# Patient Record
Sex: Male | Born: 1990 | Race: White | Hispanic: No | Marital: Single | Smoking: Never smoker
Health system: Southern US, Community
[De-identification: ages and names within clinical notes are randomized; demographics above are authoritative.]

---

## 2012-07-13 ENCOUNTER — Emergency Department: Payer: Self-pay | Admitting: Emergency Medicine

## 2012-07-21 ENCOUNTER — Ambulatory Visit: Payer: Self-pay | Admitting: Orthopedic Surgery

## 2013-11-15 ENCOUNTER — Emergency Department: Payer: Self-pay | Admitting: Emergency Medicine

## 2013-11-27 ENCOUNTER — Emergency Department: Payer: Self-pay | Admitting: Emergency Medicine

## 2014-07-10 IMAGING — CR DG SHOULDER 3+V*R*
1 series · 3 of 3 positions shown · non-contrast
Comparison: none

REASON FOR EXAM: pain
COMMENTS:

[Series 3: w shoulder internal right · 0.14mm/px · 3 of 3 slices shown]
[im 1/3]
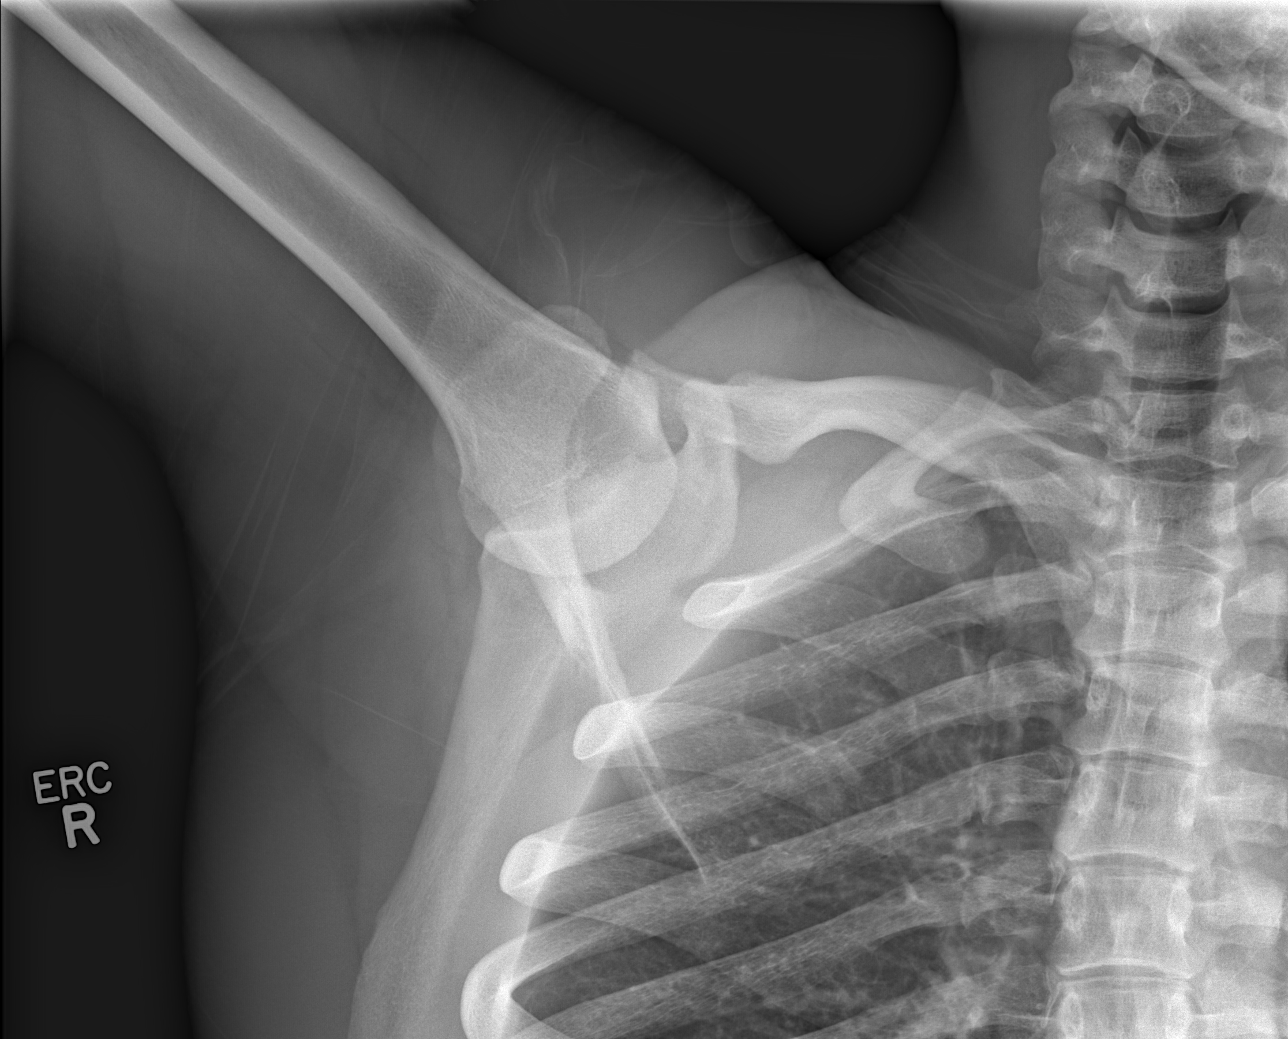
[im 2/3]
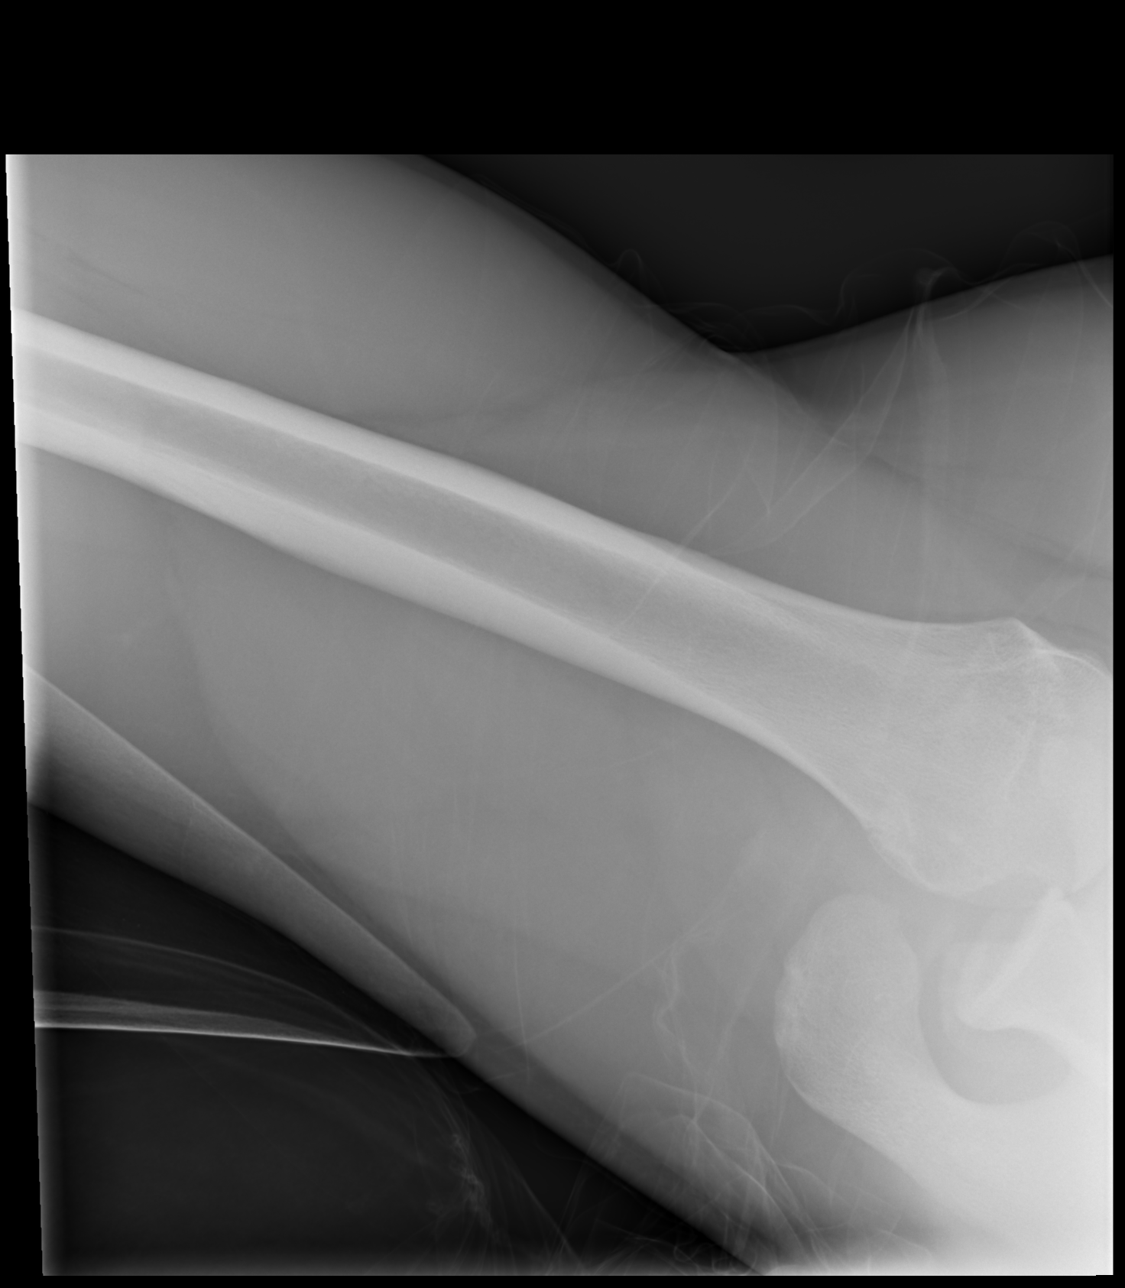
[im 3/3]
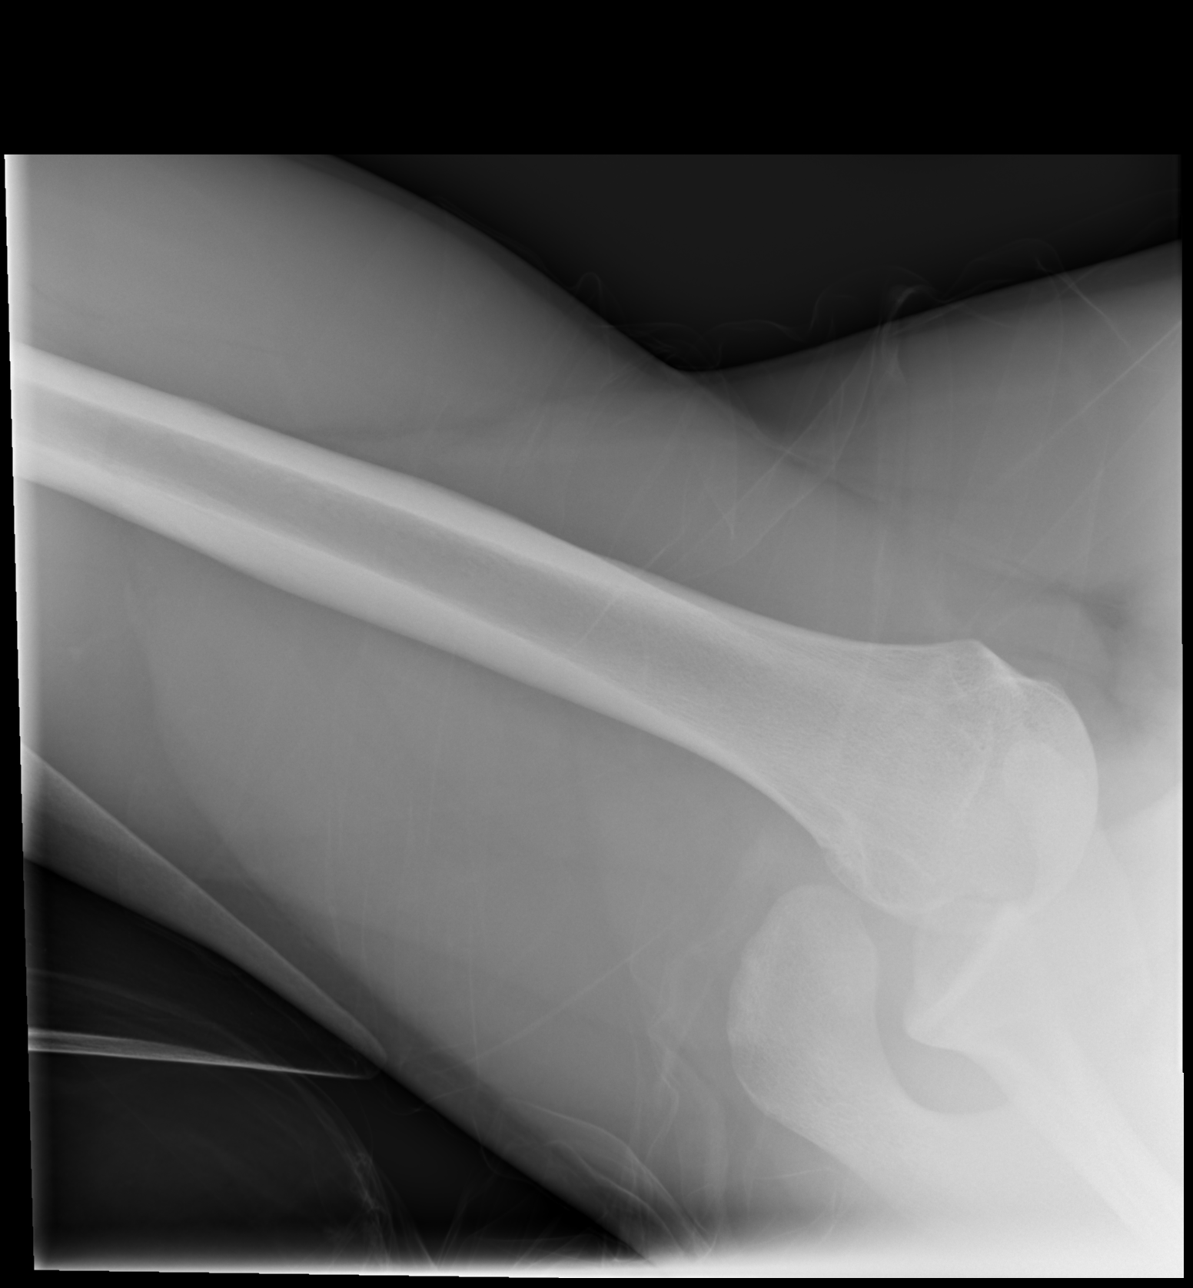

[3 of 3 positions shown; findings below may reference images not displayed]

PROCEDURE:     DXR - DXR SHOULDER RIGHT COMPLETE  - July 13, 2012  [DATE]

RESULT:     Three views of the right shoulder are submitted. The patient has
sustained anterior dislocation of the glenohumeral joint. No acute fracture
is demonstrated. The arm is fixed in abduction. The observed portions of the
right clavicle and of the upper ribs appear normal.
IMPRESSION: The patient has sustained anterior dislocation of the right
humeral head.

[REDACTED]

## 2016-07-01 ENCOUNTER — Encounter: Payer: Self-pay | Admitting: Emergency Medicine

## 2016-07-01 ENCOUNTER — Encounter
Admission: EM | Disposition: A | Discharge status: Home / Self Care | Payer: Self-pay | Source: Home / Self Care | Attending: Emergency Medicine

## 2016-07-01 ENCOUNTER — Emergency Department: Payer: BC Managed Care – PPO | Admitting: Anesthesiology

## 2016-07-01 ENCOUNTER — Observation Stay
Admission: EM | Admit: 2016-07-01 | Discharge: 2016-07-02 | Disposition: A | Discharge status: Home / Self Care | Payer: BC Managed Care – PPO | Attending: Surgery | Admitting: Surgery

## 2016-07-01 ENCOUNTER — Emergency Department: Payer: BC Managed Care – PPO

## 2016-07-01 DIAGNOSIS — K3580 Unspecified acute appendicitis: Secondary | ICD-10-CM | POA: Diagnosis present

## 2016-07-01 DIAGNOSIS — K353 Acute appendicitis with localized peritonitis: Secondary | ICD-10-CM | POA: Diagnosis not present

## 2016-07-01 DIAGNOSIS — Z882 Allergy status to sulfonamides status: Secondary | ICD-10-CM | POA: Insufficient documentation

## 2016-07-01 HISTORY — PX: LAPAROSCOPIC APPENDECTOMY: SHX408

## 2016-07-01 LAB — URINALYSIS, COMPLETE (UACMP) WITH MICROSCOPIC
BILIRUBIN URINE: NEGATIVE
Bacteria, UA: NONE SEEN
GLUCOSE, UA: NEGATIVE mg/dL
Ketones, ur: NEGATIVE mg/dL
LEUKOCYTES UA: NEGATIVE
Nitrite: NEGATIVE
PH: 6 (ref 5.0–8.0)
Protein, ur: 30 mg/dL — AB
Specific Gravity, Urine: 1.014 (ref 1.005–1.030)

## 2016-07-01 LAB — PROTIME-INR
INR: 1.31
Prothrombin Time: 16.4 seconds — ABNORMAL HIGH (ref 11.4–15.2)

## 2016-07-01 LAB — CBC
HCT: 47 % (ref 40.0–52.0)
Hemoglobin: 16.3 g/dL (ref 13.0–18.0)
MCH: 29.4 pg (ref 26.0–34.0)
MCHC: 34.6 g/dL (ref 32.0–36.0)
MCV: 85 fL (ref 80.0–100.0)
PLATELETS: 293 10*3/uL (ref 150–440)
RBC: 5.54 MIL/uL (ref 4.40–5.90)
RDW: 13.4 % (ref 11.5–14.5)
WBC: 21 10*3/uL — AB (ref 3.8–10.6)

## 2016-07-01 LAB — COMPREHENSIVE METABOLIC PANEL
ALT: 40 U/L (ref 17–63)
ANION GAP: 13 (ref 5–15)
AST: 39 U/L (ref 15–41)
Albumin: 5.4 g/dL — ABNORMAL HIGH (ref 3.5–5.0)
Alkaline Phosphatase: 33 U/L — ABNORMAL LOW (ref 38–126)
BUN: 16 mg/dL (ref 6–20)
CHLORIDE: 99 mmol/L — AB (ref 101–111)
CO2: 28 mmol/L (ref 22–32)
CREATININE: 1.24 mg/dL (ref 0.61–1.24)
Calcium: 9.9 mg/dL (ref 8.9–10.3)
Glucose, Bld: 132 mg/dL — ABNORMAL HIGH (ref 65–99)
Potassium: 3.9 mmol/L (ref 3.5–5.1)
Sodium: 140 mmol/L (ref 135–145)
Total Bilirubin: 1.8 mg/dL — ABNORMAL HIGH (ref 0.3–1.2)
Total Protein: 8.8 g/dL — ABNORMAL HIGH (ref 6.5–8.1)

## 2016-07-01 LAB — LIPASE, BLOOD: LIPASE: 138 U/L — AB (ref 11–51)

## 2016-07-01 LAB — APTT: aPTT: 33 seconds (ref 24–36)

## 2016-07-01 SURGERY — APPENDECTOMY, LAPAROSCOPIC
Anesthesia: General

## 2016-07-01 MED ORDER — FENTANYL CITRATE (PF) 250 MCG/5ML IJ SOLN
INTRAMUSCULAR | Status: AC
  Filled 2016-07-01: qty 5

## 2016-07-01 MED ORDER — ACETAMINOPHEN 10 MG/ML IV SOLN
INTRAVENOUS | Status: DC | PRN
  Administered 2016-07-01: 1000 mg via INTRAVENOUS

## 2016-07-01 MED ORDER — ONDANSETRON HCL 4 MG/2ML IJ SOLN
INTRAMUSCULAR | Status: AC
  Filled 2016-07-01: qty 2

## 2016-07-01 MED ORDER — KETOROLAC TROMETHAMINE 30 MG/ML IJ SOLN
30.0000 mg | Freq: Four times a day (QID) | INTRAMUSCULAR | Status: DC
  Administered 2016-07-02 (×3): 30 mg via INTRAVENOUS
  Filled 2016-07-01 (×10): qty 1

## 2016-07-01 MED ORDER — ONDANSETRON 4 MG PO TBDP: 4.0000 mg | ORAL_TABLET | Freq: Four times a day (QID) | ORAL | Status: DC | PRN

## 2016-07-01 MED ORDER — FENTANYL CITRATE (PF) 100 MCG/2ML IJ SOLN
INTRAMUSCULAR | Status: DC | PRN
  Administered 2016-07-01: 100 ug via INTRAVENOUS
  Administered 2016-07-01: 50 ug via INTRAVENOUS
  Administered 2016-07-01: 100 ug via INTRAVENOUS

## 2016-07-01 MED ORDER — PIPERACILLIN-TAZOBACTAM 3.375 G IVPB
3.3750 g | Freq: Three times a day (TID) | INTRAVENOUS | Status: AC
  Administered 2016-07-02 (×2): 3.375 g via INTRAVENOUS
  Filled 2016-07-01 (×2): qty 50

## 2016-07-01 MED ORDER — ACETAMINOPHEN 10 MG/ML IV SOLN
INTRAVENOUS | Status: AC
  Filled 2016-07-01: qty 100

## 2016-07-01 MED ORDER — ONDANSETRON HCL 4 MG/2ML IJ SOLN
INTRAMUSCULAR | Status: DC | PRN
  Administered 2016-07-01: 4 mg via INTRAVENOUS

## 2016-07-01 MED ORDER — PIPERACILLIN-TAZOBACTAM 3.375 G IVPB 30 MIN
3.3750 g | Freq: Once | INTRAVENOUS | Status: AC
  Administered 2016-07-01: 3.375 g via INTRAVENOUS
  Filled 2016-07-01: qty 50

## 2016-07-01 MED ORDER — ENOXAPARIN SODIUM 40 MG/0.4ML ~~LOC~~ SOLN
40.0000 mg | SUBCUTANEOUS | Status: DC
  Administered 2016-07-02: 40 mg via SUBCUTANEOUS
  Filled 2016-07-01 (×4): qty 0.4

## 2016-07-01 MED ORDER — KETOROLAC TROMETHAMINE 30 MG/ML IJ SOLN
30.0000 mg | Freq: Four times a day (QID) | INTRAMUSCULAR | Status: DC | PRN
  Filled 2016-07-01: qty 1

## 2016-07-01 MED ORDER — SUCCINYLCHOLINE CHLORIDE 20 MG/ML IJ SOLN
INTRAMUSCULAR | Status: AC
  Filled 2016-07-01: qty 1

## 2016-07-01 MED ORDER — ONDANSETRON HCL 4 MG/2ML IJ SOLN: 4.0000 mg | Freq: Four times a day (QID) | INTRAMUSCULAR | Status: DC | PRN

## 2016-07-01 MED ORDER — SUGAMMADEX SODIUM 200 MG/2ML IV SOLN
INTRAVENOUS | Status: DC | PRN
  Administered 2016-07-01: 180 mg via INTRAVENOUS

## 2016-07-01 MED ORDER — SUCCINYLCHOLINE CHLORIDE 20 MG/ML IJ SOLN
INTRAMUSCULAR | Status: DC | PRN
  Administered 2016-07-01: 100 mg via INTRAVENOUS

## 2016-07-01 MED ORDER — IOPAMIDOL (ISOVUE-300) INJECTION 61%
100.0000 mL | Freq: Once | INTRAVENOUS | Status: AC | PRN
  Administered 2016-07-01: 100 mL via INTRAVENOUS

## 2016-07-01 MED ORDER — PROPOFOL 10 MG/ML IV BOLUS
INTRAVENOUS | Status: DC | PRN
  Administered 2016-07-01: 200 mg via INTRAVENOUS

## 2016-07-01 MED ORDER — ROCURONIUM BROMIDE 100 MG/10ML IV SOLN
INTRAVENOUS | Status: DC | PRN
  Administered 2016-07-01: 10 mg via INTRAVENOUS
  Administered 2016-07-01: 20 mg via INTRAVENOUS

## 2016-07-01 MED ORDER — ROCURONIUM BROMIDE 100 MG/10ML IV SOLN
INTRAVENOUS | Status: AC
  Filled 2016-07-01: qty 1

## 2016-07-01 MED ORDER — FENTANYL CITRATE (PF) 100 MCG/2ML IJ SOLN: 25.0000 ug | INTRAMUSCULAR | Status: DC | PRN

## 2016-07-01 MED ORDER — IOPAMIDOL (ISOVUE-300) INJECTION 61%
30.0000 mL | Freq: Once | INTRAVENOUS | Status: AC | PRN
  Administered 2016-07-01: 30 mL via ORAL

## 2016-07-01 MED ORDER — SODIUM CHLORIDE 0.9 % IV BOLUS (SEPSIS): 1000.0000 mL | Freq: Once | INTRAVENOUS | Status: DC

## 2016-07-01 MED ORDER — LIDOCAINE HCL (PF) 2 % IJ SOLN
INTRAMUSCULAR | Status: AC
  Filled 2016-07-01: qty 2

## 2016-07-01 MED ORDER — PROMETHAZINE HCL 25 MG/ML IJ SOLN: 6.2500 mg | INTRAMUSCULAR | Status: DC | PRN

## 2016-07-01 MED ORDER — SEVOFLURANE IN SOLN
RESPIRATORY_TRACT | Status: AC
  Filled 2016-07-01: qty 250

## 2016-07-01 MED ORDER — ACETAMINOPHEN 500 MG PO TABS
1000.0000 mg | ORAL_TABLET | Freq: Four times a day (QID) | ORAL | Status: DC
  Administered 2016-07-02 (×3): 1000 mg via ORAL
  Filled 2016-07-01 (×3): qty 2

## 2016-07-01 MED ORDER — MIDAZOLAM HCL 2 MG/2ML IJ SOLN
INTRAMUSCULAR | Status: AC
  Filled 2016-07-01: qty 2

## 2016-07-01 MED ORDER — PROPOFOL 10 MG/ML IV BOLUS
INTRAVENOUS | Status: AC
  Filled 2016-07-01: qty 20

## 2016-07-01 MED ORDER — MORPHINE SULFATE (PF) 4 MG/ML IV SOLN: 2.0000 mg | INTRAVENOUS | Status: DC | PRN

## 2016-07-01 MED ORDER — LIDOCAINE HCL (CARDIAC) 20 MG/ML IV SOLN
INTRAVENOUS | Status: DC | PRN
  Administered 2016-07-01: 30 mg via INTRAVENOUS

## 2016-07-01 MED ORDER — FENTANYL CITRATE (PF) 100 MCG/2ML IJ SOLN
INTRAMUSCULAR | Status: AC
  Filled 2016-07-01: qty 2

## 2016-07-01 MED ORDER — LACTATED RINGERS IV SOLN
INTRAVENOUS | Status: DC
  Administered 2016-07-02: 01:00:00 via INTRAVENOUS

## 2016-07-01 MED ORDER — MIDAZOLAM HCL 2 MG/2ML IJ SOLN
INTRAMUSCULAR | Status: DC | PRN
  Administered 2016-07-01: 2 mg via INTRAVENOUS

## 2016-07-01 MED ORDER — SODIUM CHLORIDE 0.9 % IV BOLUS (SEPSIS)
1000.0000 mL | Freq: Once | INTRAVENOUS | Status: AC
  Administered 2016-07-01: 1000 mL via INTRAVENOUS

## 2016-07-01 MED ORDER — KETOROLAC TROMETHAMINE 30 MG/ML IJ SOLN
INTRAMUSCULAR | Status: DC | PRN
  Administered 2016-07-01: 30 mg via INTRAVENOUS

## 2016-07-01 MED ORDER — PANTOPRAZOLE SODIUM 40 MG IV SOLR
40.0000 mg | Freq: Every day | INTRAVENOUS | Status: DC
  Administered 2016-07-02: 40 mg via INTRAVENOUS
  Filled 2016-07-01 (×3): qty 40

## 2016-07-01 MED ORDER — ONDANSETRON HCL 4 MG/2ML IJ SOLN
4.0000 mg | Freq: Once | INTRAMUSCULAR | Status: AC
  Administered 2016-07-01: 4 mg via INTRAVENOUS
  Filled 2016-07-01: qty 2

## 2016-07-01 MED ORDER — LACTATED RINGERS IV SOLN
INTRAVENOUS | Status: DC | PRN
  Administered 2016-07-01: 22:00:00 via INTRAVENOUS

## 2016-07-01 MED ORDER — BUPIVACAINE-EPINEPHRINE 0.25% -1:200000 IJ SOLN
INTRAMUSCULAR | Status: DC | PRN
  Administered 2016-07-01: 30 mL

## 2016-07-01 MED ORDER — OXYCODONE HCL 5 MG PO TABS: 5.0000 mg | ORAL_TABLET | ORAL | Status: DC | PRN

## 2016-07-01 SURGICAL SUPPLY — 37 items
APPLIER CLIP 5 13 M/L LIGAMAX5 (MISCELLANEOUS)
BLADE CLIPPER SURG (BLADE) ×3 IMPLANT
CANISTER SUCT 1200ML W/VALVE (MISCELLANEOUS) ×3 IMPLANT
CHLORAPREP W/TINT 26ML (MISCELLANEOUS) ×3 IMPLANT
CLIP APPLIE 5 13 M/L LIGAMAX5 (MISCELLANEOUS) IMPLANT
CUTTER FLEX LINEAR 45M (STAPLE) ×3 IMPLANT
DERMABOND ADVANCED (GAUZE/BANDAGES/DRESSINGS) ×2
DERMABOND ADVANCED .7 DNX12 (GAUZE/BANDAGES/DRESSINGS) ×1 IMPLANT
ELECT CAUTERY BLADE 6.4 (BLADE) ×3 IMPLANT
ELECT REM PT RETURN 9FT ADLT (ELECTROSURGICAL) ×3
ELECTRODE REM PT RTRN 9FT ADLT (ELECTROSURGICAL) ×1 IMPLANT
ENDOPOUCH RETRIEVER 10 (MISCELLANEOUS) ×3 IMPLANT
GLOVE BIO SURGEON STRL SZ7 (GLOVE) ×12 IMPLANT
GOWN STRL REUS W/ TWL LRG LVL3 (GOWN DISPOSABLE) ×2 IMPLANT
GOWN STRL REUS W/TWL LRG LVL3 (GOWN DISPOSABLE) ×4
IRRIGATION STRYKERFLOW (MISCELLANEOUS) ×1 IMPLANT
IRRIGATOR STRYKERFLOW (MISCELLANEOUS) ×3
IV NS 1000ML (IV SOLUTION) ×2
IV NS 1000ML BAXH (IV SOLUTION) ×1 IMPLANT
NEEDLE HYPO 22GX1.5 SAFETY (NEEDLE) ×3 IMPLANT
NS IRRIG 500ML POUR BTL (IV SOLUTION) ×3 IMPLANT
PACK LAP CHOLECYSTECTOMY (MISCELLANEOUS) ×3 IMPLANT
PENCIL ELECTRO HAND CTR (MISCELLANEOUS) ×3 IMPLANT
RELOAD 45 VASCULAR/THIN (ENDOMECHANICALS) ×3 IMPLANT
RELOAD STAPLE TA45 3.5 REG BLU (ENDOMECHANICALS) ×3 IMPLANT
SCALPEL HARMONIC ACE (MISCELLANEOUS) ×3 IMPLANT
SCISSORS METZENBAUM CVD 33 (INSTRUMENTS) IMPLANT
SLEEVE ENDOPATH XCEL 5M (ENDOMECHANICALS) ×3 IMPLANT
SLEEVE SCD COMPRESS THIGH MED (MISCELLANEOUS) ×3 IMPLANT
SPONGE LAP 18X18 5 PK (GAUZE/BANDAGES/DRESSINGS) ×3 IMPLANT
SUT MNCRL AB 4-0 PS2 18 (SUTURE) ×3 IMPLANT
SUT VICRYL 0 AB UR-6 (SUTURE) ×6 IMPLANT
SYR 20CC LL (SYRINGE) ×3 IMPLANT
TRAY FOLEY W/METER SILVER 16FR (SET/KITS/TRAYS/PACK) IMPLANT
TROCAR XCEL BLUNT TIP 100MML (ENDOMECHANICALS) ×3 IMPLANT
TROCAR XCEL NON-BLD 5MMX100MML (ENDOMECHANICALS) ×3 IMPLANT
TUBING INSUF HEATED (TUBING) ×3 IMPLANT

## 2016-07-01 NOTE — ED Provider Notes (Addendum)
[facility] Emergency Department Provider Note  ____________________________________________  Time seen: Approximately 7:07 PM  I have reviewed the triage vital signs and the nursing notes.   HISTORY  Chief Complaint Abdominal Pain    HPI Gregory Walls is a 26 y.o. male , otherwise healthy, presenting with right lower quadrant pain with nausea and vomiting. The patient reports that last night he had some general malaise and nausea, and then today he developed vomiting. Initially he had periumbilical discomfort, which has now localized to the right lower quadrant over the last several hours. He is not had any fevers but he has had some shaking chills. Nothing makes the pain better or worse.   History reviewed. No pertinent past medical history.  There are no active problems to display for this patient.   History reviewed. No pertinent surgical history.    Allergies Bactrim [sulfamethoxazole-trimethoprim]  No family history on file.  Social History Social History  Substance Use Topics  . Smoking status: Never Smoker  . Smokeless tobacco: Not on file  . Alcohol use Not on file    Review of Systems Constitutional: No Fever, positive chills..No lightheadedness or syncope. Eyes: No visual changes. ENT: No sore throat. No congestion or rhinorrhea. Cardiovascular: Denies chest pain. Denies palpitations. Respiratory: Denies shortness of breath.  No cough. Gastrointestinal: Positive periumbilical, now localized the right lower quadrant abdominal pain.  Positive nausea, positive vomiting.  No diarrhea.  No constipation. Last bowel movement was 24 hours ago and it was normal. Genitourinary: Negative for dysuria. No penile discharge. No scrotal pain or swelling. Musculoskeletal: Negative for back pain. Skin: Negative for rash. Neurological: Negative for headaches. No focal numbness, tingling or weakness.   10-point ROS otherwise  negative.  ____________________________________________   PHYSICAL EXAM:  VITAL SIGNS: ED Triage Vitals  Enc Vitals Group     BP 07/01/16 1658 128/80     Pulse Rate 07/01/16 1658 (!) 124     Resp 07/01/16 1658 20     Temp 07/01/16 1658 98.8 F (37.1 C)     Temp Source 07/01/16 1658 Oral     SpO2 07/01/16 1658 99 %     Weight 07/01/16 1658 184 lb (83.5 kg)     Height 07/01/16 1658 6' (1.829 m)     Head Circumference --      Peak Flow --      Pain Score 07/01/16 1708 6     Pain Loc --      Pain Edu? --      Excl. in GC? --     Constitutional: Alert and oriented. Well appearing and in no acute distress. Answers questions appropriately. Eyes: Conjunctivae are normal.  EOMI. No scleral icterus. Head: Atraumatic. Nose: No congestion/rhinnorhea. Mouth/Throat: Mucous membranes are moist.  Neck: No stridor.  Supple.   Cardiovascular: Normal rate, regular rhythm. No murmurs, rubs or gallops.  Respiratory: Normal respiratory effort.  No accessory muscle use or retractions. Lungs CTAB.  No wheezes, rales or ronchi. Gastrointestinal: Soft, and nondistended.  Positive tenderness to palpation localized the right lower quadrant. No guarding or rebound.  No peritoneal signs. Musculoskeletal: No LE edema.  Neurologic:  A&Ox3.  Speech is clear.  Face and smile are symmetric.  EOMI.  Moves all extremities well. Skin:  Skin is warm, dry and intact. No rash noted. Psychiatric: Mood and affect are normal. Speech and behavior are normal.  Normal judgement.  ____________________________________________   LABS (all labs ordered are listed, but only abnormal  results are displayed)  Labs Reviewed  LIPASE, BLOOD - Abnormal; Notable for the following:       Result Value   Lipase 138 (*)    All other components within normal limits  COMPREHENSIVE METABOLIC PANEL - Abnormal; Notable for the following:    Chloride 99 (*)    Glucose, Bld 132 (*)    Total Protein 8.8 (*)    Albumin 5.4 (*)     Alkaline Phosphatase 33 (*)    Total Bilirubin 1.8 (*)    All other components within normal limits  CBC - Abnormal; Notable for the following:    WBC 21.0 (*)    All other components within normal limits  URINALYSIS, COMPLETE (UACMP) WITH MICROSCOPIC - Abnormal; Notable for the following:    Color, Urine YELLOW (*)    APPearance CLEAR (*)    Hgb urine dipstick LARGE (*)    Protein, ur 30 (*)    Squamous Epithelial / LPF 0-5 (*)    All other components within normal limits  PROTIME-INR - Abnormal; Notable for the following:    Prothrombin Time 16.4 (*)    All other components within normal limits  APTT   ____________________________________________  EKG  Not indicated ____________________________________________  RADIOLOGY  Ct Abdomen Pelvis W Contrast  Result Date: 07/01/2016 CLINICAL DATA:  Right lower quadrant abdomen pain for 2 days. EXAM: CT ABDOMEN AND PELVIS WITH CONTRAST TECHNIQUE: Multidetector CT imaging of the abdomen and pelvis was performed using the standard protocol following bolus administration of intravenous contrast. CONTRAST:  100mL ISOVUE-300 IOPAMIDOL (ISOVUE-300) INJECTION 61% COMPARISON:  None. FINDINGS: Lower chest: No acute abnormality. Hepatobiliary: No focal liver abnormality is seen. No gallstones, gallbladder wall thickening, or biliary dilatation. Pancreas: Unremarkable. No pancreatic ductal dilatation or surrounding inflammatory changes. Spleen: Normal in size without focal abnormality. Adrenals/Urinary Tract: Adrenal glands are unremarkable. Kidneys are normal, without renal calculi, focal lesion, or hydronephrosis. Bladder is unremarkable. Stomach/Bowel: The appendix is enlarged with enhancing wall and surrounding inflammation consistent with appendicitis. There is no small bowel obstruction or diverticulitis. The stomach is normal. Vascular/Lymphatic: No significant vascular findings are present. No enlarged abdominal or pelvic lymph nodes. Reproductive:  Prostate is unremarkable. Other: There is minimal umbilical herniation of mesenteric fat. No abdominopelvic ascites. Musculoskeletal: No acute or significant osseous findings. IMPRESSION: Acute appendicitis. These results will be called to the ordering clinician or representative by the Radiologist Assistant, and communication documented in the PACS or zVision Dashboard. Electronically Signed   By: Sherian ReinWei-Chen  Lin M.D.   On: 07/01/2016 19:47    ____________________________________________   PROCEDURES  Procedure(s) performed: None  Procedures  Critical Care performed: No ____________________________________________   INITIAL IMPRESSION / ASSESSMENT AND PLAN / ED COURSE  Pertinent labs & imaging results that were available during my care of the patient were reviewed by me and considered in my medical decision making (see chart for details).  26 y.o. male, otherwise healthy, presenting with periumbilical pain now localized the right lower quadrant associated with multiple episodes of nausea and vomiting. Overall, the patient is minimally tachycardic on arrival, but has normal heart rate on my exam. He does have an examination which is concerning for acute appendicitis. His laboratory studies show white blood cell count of 21.0. He does have some blood in his urine, but renal colic is less likely. He also has a mild elevation of his lipase, but he does not drink alcohol, does not have any right upper quadrant pain, so acute or pancreatitis as  the primary cause of his lipase elevation is very unlikely. We'll plan to get a CT of the abdomen, and initiate symptomatic treatment. At this time, the patient defers any pain medication but will give him an antiemetic prior to drinking contrast. Plan reevaluation for final disposition.  ----------------------------------------- 8:09 PM on 07/01/2016 -----------------------------------------  The patient does have acute appendicitis. I have spoken with  the general surgeon, who can admit the patient and evaluate him. When I went to the family now, they have stated that they do not want to have surgery here because of a family member that has had prior complications with surgery a [facility]. They have requested to be transferred to [facility]. I have called the transfer center. Zosyn has been ordered, the patient is nothing by mouth, and he is receiving intravenous fluids.  ----------------------------------------- 8:36 PM on 07/01/2016 -----------------------------------------  [facility] is on divert at this time, so the patient has agreed to stay here for surgery.  ____________________________________________  FINAL CLINICAL IMPRESSION(S) / ED DIAGNOSES  Final diagnoses:  Acute appendicitis, unspecified acute appendicitis type         NEW MEDICATIONS STARTED DURING THIS VISIT:  New Prescriptions   No medications on file      Rockne Menghini, MD 07/01/16 2010    Rockne Menghini, MD 07/01/16 2037

## 2016-07-01 NOTE — ED Notes (Signed)
CT called and notified that pt is ready for scan.

## 2016-07-01 NOTE — ED Notes (Signed)
Pt to CT scan.

## 2016-07-01 NOTE — Anesthesia Post-op Follow-up Note (Cosign Needed)
Anesthesia QCDR form completed.        

## 2016-07-01 NOTE — Transfer of Care (Signed)
Immediate Anesthesia Transfer of Care Note  Patient: Gregory Walls  Procedure(s) Performed: Procedure(s): APPENDECTOMY LAPAROSCOPIC (N/A)  Patient Location: PACU  Anesthesia Type:General  Level of Consciousness: sedated and patient cooperative  Airway & Oxygen Therapy: Patient Spontanous Breathing and Patient connected to face mask oxygen  Post-op Assessment: Report given to RN and Post -op Vital signs reviewed and stable  Post vital signs: Reviewed and stable  Last Vitals:  Vitals:   07/01/16 1900 07/01/16 1930  BP: (!) 142/87 (!) 144/92  Pulse: 98 (!) 110  Resp:    Temp:      Last Pain:  Vitals:   07/01/16 1841  TempSrc:   PainSc: 6          Complications: No apparent anesthesia complications

## 2016-07-01 NOTE — ED Triage Notes (Signed)
States lower R abd papin x 2 days.

## 2016-07-01 NOTE — Anesthesia Preprocedure Evaluation (Signed)
Anesthesia Evaluation  Patient identified by MRN, date of birth, ID band Patient awake    Reviewed: Allergy & Precautions, H&P , NPO status , Patient's Chart, lab work & pertinent test results, reviewed documented beta blocker date and time   History of Anesthesia Complications Negative for: history of anesthetic complications  Airway Mallampati: II  TM Distance: >3 FB Neck ROM: full    Dental  (+) Dental Advidsory Given, Teeth Intact Permanent retainer:   Pulmonary neg pulmonary ROS,           Cardiovascular Exercise Tolerance: Good negative cardio ROS       Neuro/Psych negative neurological ROS  negative psych ROS   GI/Hepatic negative GI ROS, Neg liver ROS,   Endo/Other  negative endocrine ROS  Renal/GU negative Renal ROS  negative genitourinary   Musculoskeletal   Abdominal   Peds  Hematology negative hematology ROS (+)   Anesthesia Other Findings History reviewed. No pertinent past medical history.   Reproductive/Obstetrics negative OB ROS                             Anesthesia Physical Anesthesia Plan  ASA: I and emergent  Anesthesia Plan: General ETT, Rapid Sequence and Cricoid Pressure   Post-op Pain Management:    Induction:   Airway Management Planned:   Additional Equipment:   Intra-op Plan:   Post-operative Plan:   Informed Consent: I have reviewed the patients History and Physical, chart, labs and discussed the procedure including the risks, benefits and alternatives for the proposed anesthesia with the patient or authorized representative who has indicated his/her understanding and acceptance.   Dental Advisory Given  Plan Discussed with: Anesthesiologist, CRNA and Surgeon  Anesthesia Plan Comments:         Anesthesia Quick Evaluation

## 2016-07-01 NOTE — Op Note (Signed)
laparascopic appendectomy   Velva HarmanBlayne W Vila Date of operation:  07/01/2016  Indications: The patient presented with a history of  abdominal pain. Workup has revealed findings consistent with acute appendicitis.  Pre-operative Diagnosis: Acute appendicitis without mention of peritonitis  Post-operative Diagnosis: Same  Surgeon: Sterling Bigiego Dezirea Mccollister, MD, FACS  Anesthesia: General with endotracheal tube  Findings:  Acute non perforated appendicitis with tip necrosis  Estimated Blood Loss: 10cc         Specimens: appendix         Complications:  none  Procedure Details  The patient was seen again in the preop area. The options of surgery versus observation were reviewed with the patient and/or family. The risks of bleeding, infection, recurrence of symptoms, negative laparoscopy, potential for an open procedure, bowel injury, abscess or infection, were all reviewed as well. The patient was taken to Operating Room, identified as Andrey CampanileBlayne W Ricotta and the procedure verified as laparoscopic appendectomy. A Time Out was held and the above information confirmed.  The patient was placed in the supine position and general anesthesia was induced.  Antibiotic prophylaxis was administered and VT E prophylaxis was in place. A Foley catheter was placed by the nursing staff.   The abdomen was prepped and draped in a sterile fashion. An infraumbilical incision was made. A cutdown technique was used to enter the abdominal cavity. Two vicryl stitches were placed on the fascia and a Hasson trocar inserted. Pneumoperitoneum obtained. Two 5 mm ports were placed under direct visualization.   The appendix was identified and found to be acutely inflamed.  The appendix was carefully dissected. The mesoappendix was divided with Harmonic scalpel. There was a loop of TI attached to the mesoappendix and we used very careful blunt dissection to dissect the appendix away from the bowel. The base of the appendix was dissected out  and divided with a standard load Endo GIA.The appendix was placed in a Endo Catch bag and removed via the Hasson port. The right lower quadrant and pelvis was then irrigated with  normal saline which was aspirated. Inspection  failed to identify any additional bleeding and there were no signs of bowel injury. Again the right lower quadrant was inspected there was no sign of bleeding or bowel injury therefore pneumoperitoneum was released, all ports were removed.  The umbilical fascia was closed with 0 Vicryl interrupted sutures and the skin incisions were approximated with subcuticular 4-0 Monocryl. Dermabond was placed The patient tolerated the procedure well, there were no complications. The sponge lap and needle count were correct at the end of the procedure.  The patient was taken to the recovery room in stable condition to be admitted for continued care.    Sterling Bigiego Ashleigh Luckow, MD FACS

## 2016-07-01 NOTE — Anesthesia Procedure Notes (Signed)
Procedure Name: Intubation Date/Time: 07/01/2016 10:12 PM Performed by: Waldo LaineJUSTIS, Lizmarie Witters Pre-anesthesia Checklist: Patient identified, Emergency Drugs available, Suction available, Timeout performed and Patient being monitored Patient Re-evaluated:Patient Re-evaluated prior to inductionOxygen Delivery Method: Circle system utilized Preoxygenation: Pre-oxygenation with 100% oxygen Intubation Type: IV induction, Rapid sequence and Cricoid Pressure applied Laryngoscope Size: Miller and 2 Grade View: Grade I Tube type: Oral Tube size: 7.5 mm Number of attempts: 1 Airway Equipment and Method: Stylet Placement Confirmation: ETT inserted through vocal cords under direct vision,  positive ETCO2 and breath sounds checked- equal and bilateral Secured at: 22 cm Tube secured with: Tape Dental Injury: Teeth and Oropharynx as per pre-operative assessment

## 2016-07-01 NOTE — H&P (Signed)
Patient ID: Gregory Walls, male   DOB: 06-25-1990, 26 y.o.   MRN: [id number]  HPI Gregory CampanileBlayne W Trapp is a 26 y.o. male with a 24-hour history of abdominal pain. Pain was initially periumbilical and NOW it localizes to the right lower quadrant and is sharp. Currently the pain is moderate to severe sharp in nature and constant. Pain is worsening with movement. Patient had associated nausea vomiting and chills. No previous abdominal operations. He has good cardiovascular reserve and is able to perform more than 6 Mets of activity without any shortness of breath or chest pain. CT scan personal review there is evidence of dilated appendix with periappendiceal fat inflammatory response. No evidence of perforation and no evidence of an abscess. No free air. His white count is elevated to 21,000 with slight increase in the creatinine.  HPI  History reviewed. No pertinent past medical history.  History reviewed. No pertinent surgical history.  No pertinent Fam Hx   Social History Social History  Substance Use Topics  . Smoking status: Never Smoker  . Smokeless tobacco: Not on file  . Alcohol use Not on file    Allergies  Allergen Reactions  . Bactrim [Sulfamethoxazole-Trimethoprim] Rash    chills    Current Facility-Administered Medications  Medication Dose Route Frequency Provider Last Rate Last Dose  . sodium chloride 0.9 % bolus 1,000 mL  1,000 mL Intravenous Once Sharma CovertNorman, Anne-Caroline, MD      . sodium chloride 0.9 % bolus 1,000 mL  1,000 mL Intravenous Once Masaichi Kracht, Merri Rayiego F, MD       No current outpatient prescriptions on file.     Review of Systems Full ROS  was asked and was negative except for the information on the HPI  Physical Exam Blood pressure (!) 145/98, pulse (!) 102, temperature 98.8 F (37.1 C), temperature source Oral, resp. rate 20, height 6' (1.829 m), weight 83.5 kg (184 lb), SpO2 100 %. CONSTITUTIONAL: NOn toxic but tachycardic. EYES: Pupils are equal, round, and  reactive to light, Sclera are non-icteric. EARS, NOSE, MOUTH AND THROAT: The oropharynx is clear. The oral mucosa is pink and moist. Hearing is intact to voice. LYMPH NODES:  Lymph nodes in the neck are normal. RESPIRATORY:  Lungs are clear. There is normal respiratory effort, with equal breath sounds bilaterally, and without pathologic use of accessory muscles. CARDIOVASCULAR: Heart is regular without murmurs, gallops, or rubs. GI: The abdomen is soft TTP RLQ + Mcburney sign. w focal peritonitis GU: Rectal deferred.   MUSCULOSKELETAL: Normal muscle strength and tone. No cyanosis or edema.   SKIN: Turgor is good and there are no pathologic skin lesions or ulcers. NEUROLOGIC: Motor and sensation is grossly normal. Cranial nerves are grossly intact. PSYCH:  Oriented to person, place and time. Affect is normal.  Data Reviewed  I have personally reviewed the patient's imaging, laboratory findings and medical records.    Assessment/Plan 26 year old male with acute appendicitis. I do think that given his physical exam and inflammatory response is better served with an appendectomy. The risks, benefits, complications, treatment options, and expected outcomes were discussed with the patient. The treatment of antibiotics alone was discussed giving a 20% chance that this could fail and surgery would be necessary.  Also discussed continuing to the operating room for Laparoscopic Appendectomy.  The possibilities of  bleeding, recurrent infection, perforation of viscus, finding a normal appendix, the need for additional procedures, failure to diagnose a condition, conversion to open procedure and creating a complication requiring transfusion  or further operations were discussed. The patient was given the opportunity to ask questions and have them answered.  Patient would like to proceed with Laparoscopic Appendectomy and consent was obtained.  I'll give him a liter of saline and start him on broad-spectrum  antibiotics. We will go ahead and post him tonight for an appendectomy  Sterling Big, MD FACS General Surgeon 07/01/2016, 9:07 PM

## 2016-07-02 ENCOUNTER — Encounter: Payer: Self-pay | Admitting: Surgery

## 2016-07-02 LAB — BASIC METABOLIC PANEL
ANION GAP: 6 (ref 5–15)
BUN: 14 mg/dL (ref 6–20)
CHLORIDE: 104 mmol/L (ref 101–111)
CO2: 29 mmol/L (ref 22–32)
Calcium: 8.8 mg/dL — ABNORMAL LOW (ref 8.9–10.3)
Creatinine, Ser: 1.25 mg/dL — ABNORMAL HIGH (ref 0.61–1.24)
GFR calc non Af Amer: >60 mL/min (ref >60)
GLUCOSE: 116 mg/dL — AB (ref 65–99)
POTASSIUM: 4.5 mmol/L (ref 3.5–5.1)
Sodium: 139 mmol/L (ref 135–145)

## 2016-07-02 LAB — CBC
HEMATOCRIT: 39.2 % — AB (ref 40.0–52.0)
HEMOGLOBIN: 13.7 g/dL (ref 13.0–18.0)
MCH: 29.8 pg (ref 26.0–34.0)
MCHC: 34.9 g/dL (ref 32.0–36.0)
MCV: 85.3 fL (ref 80.0–100.0)
Platelets: 228 10*3/uL (ref 150–440)
RBC: 4.59 MIL/uL (ref 4.40–5.90)
RDW: 13.3 % (ref 11.5–14.5)
WBC: 13.8 10*3/uL — ABNORMAL HIGH (ref 3.8–10.6)

## 2016-07-02 MED ORDER — OXYCODONE HCL 5 MG PO TABS: 5.0000 mg | ORAL_TABLET | ORAL | 0 refills | Status: DC | PRN

## 2016-07-02 NOTE — Discharge Summary (Signed)
Patient ID: Gregory Walls MRN: [id number] DOB/AGE: Nov 28, 1990 26 y.o.  Admit date: 07/01/2016 Discharge date: 07/02/2016  Discharge Diagnoses:  Appendicitis  Procedures Performed: Laparoscopic Appendectomy  Discharged Condition: good  Hospital Course: Patient brought into the hospital with appendicitis. Underwent an uncomplicated laparoscopic appendectomy. Able to be discharged home the first post operative day.  Discharge Orders: Discharge Instructions    Call MD for:  difficulty breathing, headache or visual disturbances    Complete by:  As directed    Call MD for:  persistant nausea and vomiting    Complete by:  As directed    Call MD for:  redness, tenderness, or signs of infection (pain, swelling, redness, odor or green/yellow discharge around incision site)    Complete by:  As directed    Call MD for:  severe uncontrolled pain    Complete by:  As directed    Call MD for:  temperature >100.4    Complete by:  As directed    Diet - low sodium heart healthy    Complete by:  As directed    Increase activity slowly    Complete by:  As directed       Disposition: Final discharge disposition not confirmed  Discharge Medications: Allergies as of 07/02/2016      Reactions   Bactrim [sulfamethoxazole-trimethoprim] Rash   chills      Medication List    TAKE these medications   oxyCODONE 5 MG immediate release tablet Commonly known as:  Oxy IR/ROXICODONE Take 1-2 tablets (5-10 mg total) by mouth every 4 (four) hours as needed for moderate pain.        Follwup: Follow-up Information    Pabon, HawaiiDiego F, MD. Go in 1 week(s).   Specialty:  General Surgery Why:  postop Contact information: [address] [place] [address] [phone]           Signed: Ricarda FrameCharles Leanore Biggers 07/02/2016, 12:45 PM

## 2016-07-02 NOTE — Progress Notes (Signed)
1 Day Post-Op   Subjective:  Status post lap scopic appendectomy. Patient reports feeling much better today. His diet.  Vital signs in last 24 hours: Temp:  [98 F (36.7 C)-99.6 F (37.6 C)] 98 F (36.7 C) (05/08 0431) Pulse Rate:  [63-135] 63 (05/08 0431) Resp:  [11-30] 16 (05/08 0431) BP: (110-145)/(55-98) 110/55 (05/08 0431) SpO2:  [98 %-100 %] 100 % (05/08 0431) Weight:  [83.5 kg (184 lb)-84.6 kg (186 lb 6.4 oz)] 84.6 kg (186 lb 6.4 oz) (05/08 0043) Last BM Date: 07/01/16  Intake/Output from previous day: 05/07 0701 - 05/08 0700 In: 1323 [P.O.:240; I.V.:1083] Out: 10 [Blood:10]  GI: Abdomen soft, appropriately tender to palpation at his incision sites, no right lower quadrant tenderness. Well approximated laparoscopic incision sites without evidence of erythema or drainage  Lab Results:  CBC  Recent Labs  07/01/16 1708 07/02/16 0535  WBC 21.0* 13.8*  HGB 16.3 13.7  HCT 47.0 39.2*  PLT 293 228   CMP     Component Value Date/Time   NA 139 07/02/2016 0535   K 4.5 07/02/2016 0535   CL 104 07/02/2016 0535   CO2 29 07/02/2016 0535   GLUCOSE 116 (H) 07/02/2016 0535   BUN 14 07/02/2016 0535   CREATININE 1.25 (H) 07/02/2016 0535   CALCIUM 8.8 (L) 07/02/2016 0535   PROT 8.8 (H) 07/01/2016 1708   ALBUMIN 5.4 (H) 07/01/2016 1708   AST 39 07/01/2016 1708   ALT 40 07/01/2016 1708   ALKPHOS 33 (L) 07/01/2016 1708   BILITOT 1.8 (H) 07/01/2016 1708   GFRNONAA >60 07/02/2016 0535   GFRAA >60 07/02/2016 0535   PT/INR  Recent Labs  07/01/16 1914  LABPROT 16.4*  INR 1.31    Studies/Results: Ct Abdomen Pelvis W Contrast  Result Date: 07/01/2016 CLINICAL DATA:  Right lower quadrant abdomen pain for 2 days. EXAM: CT ABDOMEN AND PELVIS WITH CONTRAST TECHNIQUE: Multidetector CT imaging of the abdomen and pelvis was performed using the standard protocol following bolus administration of intravenous contrast. CONTRAST:  100mL ISOVUE-300 IOPAMIDOL (ISOVUE-300) INJECTION 61%  COMPARISON:  None. FINDINGS: Lower chest: No acute abnormality. Hepatobiliary: No focal liver abnormality is seen. No gallstones, gallbladder wall thickening, or biliary dilatation. Pancreas: Unremarkable. No pancreatic ductal dilatation or surrounding inflammatory changes. Spleen: Normal in size without focal abnormality. Adrenals/Urinary Tract: Adrenal glands are unremarkable. Kidneys are normal, without renal calculi, focal lesion, or hydronephrosis. Bladder is unremarkable. Stomach/Bowel: The appendix is enlarged with enhancing wall and surrounding inflammation consistent with appendicitis. There is no small bowel obstruction or diverticulitis. The stomach is normal. Vascular/Lymphatic: No significant vascular findings are present. No enlarged abdominal or pelvic lymph nodes. Reproductive: Prostate is unremarkable. Other: There is minimal umbilical herniation of mesenteric fat. No abdominopelvic ascites. Musculoskeletal: No acute or significant osseous findings. IMPRESSION: Acute appendicitis. These results will be called to the ordering clinician or representative by the Radiologist Assistant, and communication documented in the PACS or zVision Dashboard. Electronically Signed   By: Sherian ReinWei-Chen  Lin M.D.   On: 07/01/2016 19:47    Assessment/Plan: 26 year old male status post laparoscopic appendectomy. Doing well. Encourage ambulation, incentive spirometer usage. Advanced diet as tolerated. Likely discharge home later today.   Ricarda Frameharles Sieanna Vanstone, MD [facility] General Surgeon [facility]  Day [facility] [phone](7a-7p) [phone] Night [facility] [phone](7p-7a) [phone]  07/02/2016

## 2016-07-02 NOTE — Discharge Instructions (Signed)
Laparoscopic Appendectomy, Adult, Care After Refer to this sheet in the next few weeks. These instructions provide you with information about caring for yourself after your procedure. Your health care provider may also give you more specific instructions. Your treatment has been planned according to current medical practices, but problems sometimes occur. Call your health care provider if you have any problems or questions after your procedure. What can I expect after the procedure? After the procedure, it is common to have:  A decrease in your energy level.  Mild pain in the area where the surgical cuts (incisions) were made.  Constipation. This can be caused by pain medicine and a decrease in your activity. Follow these instructions at home: Medicines   Take over-the-counter and prescription medicines only as told by your health care provider.  Do not drive for 24 hours if you received a sedative.  Do not drive or operate heavy machinery while taking prescription pain medicine.  If you were prescribed an antibiotic medicine, take it as told by your health care provider. Do not stop taking the antibiotic even if you start to feel better. Activity   For 3 weeks or as long as told by your health care provider:  Do not lift anything that is heavier than 10 pounds (4.5 kg).  Do not play contact sports.  Gradually return to your normal activities. Ask your health care provider what activities are safe for you.  Refrain from work at least 1 week after surgery. Your surgeon will release you to return to work. Bathing   Keep your incisions clean and dry. Clean them as often as told by your health care provider:  Gently wash the incisions with soap and water.  Rinse the incisions with water to remove all soap.  Pat the incisions dry with a clean towel. Do not rub the incisions.  You may take showers 24 hours after surgery.  Do not take baths, swim, or use hot tubs for 2 weeks or as  told by your health care provider. Incision care   Follow instructions from your healthcare provider about how to take care of your incisions. Make sure you:  Wash your hands with soap and water before you change your bandage (dressing). If soap and water are not available, use hand sanitizer.  Change your dressing as told by your health care provider.  Leave stitches (sutures), skin glue, or adhesive strips in place. These skin closures may need to stay in place for 2 weeks or longer. If adhesive strip edges start to loosen and curl up, you may trim the loose edges. Do not remove adhesive strips completely unless your health care provider tells you to do that.  Check your incision areas every day for signs of infection. Check for:  More redness, swelling, or pain.  More fluid or blood.  Warmth.  Pus or a bad smell. Other Instructions   If you were sent home with a drain, follow instructions from your health care provider about how to care for the drain and how to empty it.  Take deep breaths. This helps to prevent your lungs from becoming inflamed.  To relieve and prevent constipation:  Drink plenty of fluids.  Eat plenty of fruits and vegetables.  Keep all follow-up visits as told by your health care provider. This is important. Contact a health care provider if:  You have more redness, swelling, or pain around an incision.  You have more fluid or blood coming from an incision.  Your incision feels warm to the touch.  You have pus or a bad smell coming from an incision or dressing.  Your incision edges break open after your sutures have been removed.  You have increasing pain in your shoulders.  You feel dizzy or you faint.  You develop shortness of breath.  You keep feeling nauseous or vomiting.  You have diarrhea or you cannot control your bowel functions.  You lose your appetite.  You develop swelling or pain in your legs. Get help right away if:  You  have a fever.  You develop a rash.  You have difficulty breathing.  You have sharp pains in your chest. This information is not intended to replace advice given to you by your health care provider. Make sure you discuss any questions you have with your health care provider. Document Released: 02/11/2005 Document Revised: 07/14/2015 Document Reviewed: 08/01/2014 Elsevier Interactive Patient Education  2017 [employer]

## 2016-07-02 NOTE — Anesthesia Postprocedure Evaluation (Signed)
Anesthesia Post Note  Patient: Gregory Walls  Procedure(s) Performed: Procedure(s) (LRB): APPENDECTOMY LAPAROSCOPIC (N/A)  Patient location during evaluation: PACU Anesthesia Type: General Level of consciousness: awake and alert Pain management: pain level controlled Vital Signs Assessment: post-procedure vital signs reviewed and stable Respiratory status: spontaneous breathing, nonlabored ventilation, respiratory function stable and patient connected to nasal cannula oxygen Cardiovascular status: blood pressure returned to baseline and stable Postop Assessment: no signs of nausea or vomiting Anesthetic complications: no     Last Vitals:  Vitals:   07/02/16 0042 07/02/16 0133  BP: 132/72 118/64  Pulse: (!) 103 91  Resp: 20 16  Temp: 36.9 C 36.9 C    Last Pain:  Vitals:   07/02/16 0133  TempSrc: Oral  PainSc:                  Lenard SimmerAndrew Jillaine Waren

## 2016-07-03 LAB — HIV ANTIBODY (ROUTINE TESTING W REFLEX): HIV Screen 4th Generation wRfx: NONREACTIVE

## 2016-07-03 LAB — SURGICAL PATHOLOGY

## 2016-07-11 ENCOUNTER — Encounter: Payer: Self-pay | Admitting: Surgery

## 2016-07-11 ENCOUNTER — Ambulatory Visit (INDEPENDENT_AMBULATORY_CARE_PROVIDER_SITE_OTHER): Payer: BC Managed Care – PPO | Admitting: Surgery

## 2016-07-11 VITALS — BP 122/75 | HR 65 | Temp 98.1°F | Ht 72.0 in | Wt 187.0 lb

## 2016-07-11 DIAGNOSIS — Z09 Encounter for follow-up examination after completed treatment for conditions other than malignant neoplasm: Secondary | ICD-10-CM

## 2016-07-11 NOTE — Progress Notes (Signed)
S/p lap appy 5/7 Doing well no complaints Taking Po  PE NAD Abd: incisions c/d/i, no infection  A/P Doing well No complications No heavy lifting Path d/w pt

## 2016-07-11 NOTE — Patient Instructions (Addendum)
Please call our office with any questions or concerns.  You may now submerge in a tub, hot tub, or pool since incisions are completely sealed.  Use sun block to incision area over the next year if this area will be exposed to sun. This helps decrease scarring.  You may resume your normal activities on 08/12/2016. At that time- Listen to your body when lifting, if you have pain when lifting, stop and then try again in a few days. Pain after doing exercises or activities of daily living is normal as you get back in to your normal routine.  If you develop redness, drainage, or pain at incision sites- call our office immediately and speak with a nurse.

## 2016-07-15 ENCOUNTER — Telehealth: Payer: Self-pay

## 2016-07-15 NOTE — Telephone Encounter (Signed)
Spoke with patient at this time. He denies significant bruising and abdominal pain. He is curious as to why he has "knots" under his skin. I did explain that these "knots" that he is feeling directly under his incisions are most likely suture knots and will dissolve over time. He verbalizes understanding of this. The patient states that overall, he is doing wonderfully.

## 2016-07-15 NOTE — Telephone Encounter (Signed)
Returned phone call to patient at this time. No answer. Left voicemail for return phone call. 

## 2016-07-15 NOTE — Telephone Encounter (Signed)
Patient is returning your phone call. Please call patient and advice.   

## 2016-07-15 NOTE — Telephone Encounter (Signed)
Patient was just seen in our office on 07/11/16. He had a Laparoscopic Appendectomy on 07/01/16 with Dr. Everlene FarrierPabon. He is feeling a hard mass under his incision site and it's causing him no pain. He has been researching this online and feels like it might just be scar tissue but would like some clarification. Please call patient and advice.

## 2019-06-06 ENCOUNTER — Encounter: Payer: Self-pay | Admitting: Emergency Medicine

## 2019-06-06 ENCOUNTER — Emergency Department
Admission: EM | Admit: 2019-06-06 | Discharge: 2019-06-06 | Disposition: A | Discharge status: Home / Self Care | Payer: BC Managed Care – PPO | Attending: Emergency Medicine | Admitting: Emergency Medicine

## 2019-06-06 ENCOUNTER — Other Ambulatory Visit: Payer: Self-pay

## 2019-06-06 ENCOUNTER — Emergency Department: Payer: BC Managed Care – PPO

## 2019-06-06 DIAGNOSIS — S83015A Lateral dislocation of left patella, initial encounter: Secondary | ICD-10-CM | POA: Diagnosis not present

## 2019-06-06 DIAGNOSIS — Y9289 Other specified places as the place of occurrence of the external cause: Secondary | ICD-10-CM | POA: Diagnosis not present

## 2019-06-06 DIAGNOSIS — Y9389 Activity, other specified: Secondary | ICD-10-CM | POA: Diagnosis not present

## 2019-06-06 DIAGNOSIS — S83005A Unspecified dislocation of left patella, initial encounter: Secondary | ICD-10-CM

## 2019-06-06 DIAGNOSIS — S8992XA Unspecified injury of left lower leg, initial encounter: Secondary | ICD-10-CM | POA: Diagnosis present

## 2019-06-06 DIAGNOSIS — Y999 Unspecified external cause status: Secondary | ICD-10-CM | POA: Diagnosis not present

## 2019-06-06 DIAGNOSIS — X509XXA Other and unspecified overexertion or strenuous movements or postures, initial encounter: Secondary | ICD-10-CM | POA: Insufficient documentation

## 2019-06-06 MED ORDER — IBUPROFEN 600 MG PO TABS: 600.0000 mg | ORAL_TABLET | Freq: Four times a day (QID) | ORAL | 0 refills | Status: AC | PRN

## 2019-06-06 MED ORDER — HYDROMORPHONE HCL 1 MG/ML IJ SOLN
INTRAMUSCULAR | Status: AC
  Administered 2019-06-06: 22:00:00 0.5 mg via INTRAVENOUS
  Filled 2019-06-06: qty 1

## 2019-06-06 MED ORDER — OXYCODONE HCL 5 MG PO TABS: 5.0000 mg | ORAL_TABLET | Freq: Three times a day (TID) | ORAL | 0 refills | Status: AC | PRN

## 2019-06-06 MED ORDER — HYDROMORPHONE HCL 1 MG/ML IJ SOLN: 0.5000 mg | Freq: Once | INTRAMUSCULAR | Status: AC

## 2019-06-06 MED ORDER — HYDROMORPHONE HCL 1 MG/ML IJ SOLN
0.5000 mg | Freq: Once | INTRAMUSCULAR | Status: AC
  Administered 2019-06-06: 23:00:00 0.5 mg via INTRAVENOUS

## 2019-06-06 NOTE — ED Provider Notes (Signed)
[facility] Emergency Department Provider Note  ____________________________________________   First MD Initiated Contact with Patient 06/06/19 2210     (approximate)  I have reviewed the triage vital signs and the nursing notes.   HISTORY  Chief Complaint Knee Injury    HPI Gregory Walls is a 29 y.o. male with history of patella dislocation comes in with concerns for patella dislocation.  Patient states that just prior to arrival he was getting to his car and he felt his patella pop out of place.  States that he is had this happen before.  Patient is having pain in his knee that is constant, severe, nothing makes it better, worse with trying to move it.  Patient states that this is happened multiple times.          No past medical history on file.  Patient Active Problem List   Diagnosis Date Noted  . Appendicitis, acute 07/01/2016  . Acute appendicitis     Past Surgical History:  Procedure Laterality Date  . LAPAROSCOPIC APPENDECTOMY N/A 07/01/2016   Procedure: APPENDECTOMY LAPAROSCOPIC;  Surgeon: Jules Husbands, MD;  Location: [facility] ORS;  Service: General;  Laterality: N/A;    Prior to Admission medications   Not on File    Allergies Bactrim [sulfamethoxazole-trimethoprim]  No family history on file.  Social History Social History   Tobacco Use  . Smoking status: Never Smoker  . Smokeless tobacco: Never Used  Substance Use Topics  . Alcohol use: Not on file  . Drug use: Not on file      Review of Systems Constitutional: No fever/chills Eyes: No visual changes. ENT: No sore throat. Cardiovascular: Denies chest pain. Respiratory: Denies shortness of breath. Gastrointestinal: No abdominal pain.  No nausea, no vomiting.  No diarrhea.  No constipation. Genitourinary: Negative for dysuria. Musculoskeletal: Negative for back pain.  Positive knee pain Skin: Negative for rash. Neurological: Negative for headaches, focal  weakness or numbness. All other ROS negative ____________________________________________   PHYSICAL EXAM:  VITAL SIGNS: There were no vitals taken for this visit.  Constitutional: Alert and oriented. Well appearing and in no acute distress. Eyes: Conjunctivae are normal. EOMI. Head: Atraumatic. Nose: No congestion/rhinnorhea. Mouth/Throat: Mucous membranes are moist.   Neck: No stridor. Trachea Midline. FROM Cardiovascular: Normal rate, regular rhythm. Grossly normal heart sounds.  Good peripheral circulation. Respiratory: Normal respiratory effort.  No retractions. Lungs CTAB. Gastrointestinal: Soft and nontender. No distention. No abdominal bruits.  Musculoskeletal: Left leg flexed with obvious deformity to the knee with patella palpated laterally.  Good distal pulse. Neurologic:  Normal speech and language. No gross focal neurologic deficits are appreciated.  Skin:  Skin is warm, dry and intact. No rash noted. Psychiatric: Mood and affect are normal. Speech and behavior are normal. GU: Deferred   ____________________________________________  RADIOLOGY Robert Bellow, personally viewed and evaluated these images (plain radiographs) as part of my medical decision making, as well as reviewing the written report by the radiologist.  ED MD interpretation: No fracture  Official radiology report(s): DG Knee Complete 4 Views Left  Result Date: 06/06/2019 CLINICAL DATA:  Status post trauma. EXAM: LEFT KNEE - COMPLETE 4+ VIEW COMPARISON:  None. FINDINGS: No evidence of fracture, dislocation, or joint effusion. No evidence of arthropathy or other focal bone abnormality. Soft tissues are unremarkable. IMPRESSION: Negative. Electronically Signed   By: Virgina Norfolk M.D.   On: 06/06/2019 23:29    ____________________________________________   PROCEDURES  Procedure(s) performed (including Critical  Care):  Reduction of dislocation  Date/Time: 06/06/2019 11:38 PM Performed by:  Concha Se, MD Authorized by: Concha Se, MD  Consent: Verbal consent obtained. Consent given by: patient Patient identity confirmed: verbally with patient Time out: Immediately prior to procedure a "time out" was called to verify the correct patient, procedure, equipment, support staff and site/side marked as required. Local anesthesia used: no  Anesthesia: Local anesthesia used: no  Sedation: Patient sedated: no  Patient tolerance: patient tolerated the procedure well with no immediate complications Comments: Patient underwent patella dislocation reduction      ____________________________________________   INITIAL IMPRESSION / ASSESSMENT AND PLAN / ED COURSE  Gregory Walls was evaluated in Emergency Department on 06/06/2019 for the symptoms described in the history of present illness. He was evaluated in the context of the global COVID-19 pandemic, which necessitated consideration that the patient might be at risk for infection with the SARS-CoV-2 virus that causes COVID-19. Institutional protocols and algorithms that pertain to the evaluation of patients at risk for COVID-19 are in a state of rapid change based on information released by regulatory bodies including the CDC and federal and state organizations. These policies and algorithms were followed during the patient's care in the ED.    Patient comes in with history of patella dislocation.  Patient exam is consistent with patellar dislocation.  Patient not fall to the ground to suggest fractures.  He had good distal pulse and no evidence of vascular or neurological injury.  Patient was given IV Dilaudid and patient's dislocation was reduced easily.  Patient was placed in a knee immobilizer.  Patient had good distal pulse and was neuro intact post reduction.  X-ray post reduction had no evidence of fractures.  Patient will follow up with orthopedic surgeon.  Patient provided prescription for ibuprofen and a few doses of  oxycodone.  Patient understands not to drive or work while on this.  I discussed the provisional nature of ED diagnosis, the treatment so far, the ongoing plan of care, follow up appointments and return precautions with the patient and any family or support people present. They expressed understanding and agreed with the plan, discharged home.           ____________________________________________   FINAL CLINICAL IMPRESSION(S) / ED DIAGNOSES   Final diagnoses:  Dislocation of left patella, initial encounter      MEDICATIONS GIVEN DURING THIS VISIT:  Medications  HYDROmorphone (DILAUDID) injection 0.5 mg (has no administration in time range)  HYDROmorphone (DILAUDID) injection 0.5 mg (0.5 mg Intravenous Given 06/06/19 2223)     ED Discharge Orders         Ordered    ibuprofen (ADVIL) 600 MG tablet  Every 6 hours PRN     06/06/19 2337    oxyCODONE (ROXICODONE) 5 MG immediate release tablet  Every 8 hours PRN     06/06/19 2337           Note:  This document was prepared using Dragon voice recognition software and may include unintentional dictation errors.   Concha Se, MD 06/06/19 2340

## 2019-06-06 NOTE — ED Triage Notes (Signed)
Patient arrives via EMS after "patellar dislocation" this evening, patient has dislocated his patella before. No other complaints. Given fentanyl on ambulance

## 2019-06-06 NOTE — ED Notes (Signed)
Patient's knee reset by Dr. Fuller Plan. Patient tolerated well, knee immobilizer applied. Patient understands how to apply immobilizer. Patient provided with crutches. Wife is at bedside

## 2019-06-06 NOTE — Discharge Instructions (Addendum)
You had a patella dislocation.  We replaced it. You should use the knee immobilizer.  Take ibuprofen 600 mg every 6 hours for pain.  Take the oxycodone for breakthrough pain.  He should follow-up with orthopedic surgery.  You are weightbearing as tolerated.  IMPRESSION:  Negative.

## 2019-07-14 ENCOUNTER — Other Ambulatory Visit: Payer: Self-pay | Admitting: Orthopedic Surgery

## 2019-07-14 DIAGNOSIS — S83005A Unspecified dislocation of left patella, initial encounter: Secondary | ICD-10-CM

## 2019-07-29 ENCOUNTER — Ambulatory Visit
Admission: RE | Admit: 2019-07-29 | Discharge: 2019-07-29 | Disposition: A | Discharge status: Home / Self Care | Payer: BC Managed Care – PPO | Source: Ambulatory Visit | Attending: Orthopedic Surgery | Admitting: Orthopedic Surgery

## 2019-07-29 DIAGNOSIS — S83005A Unspecified dislocation of left patella, initial encounter: Secondary | ICD-10-CM | POA: Diagnosis present

## 2020-01-07 ENCOUNTER — Other Ambulatory Visit: Payer: Self-pay | Admitting: Nephrology

## 2020-01-07 DIAGNOSIS — R3129 Other microscopic hematuria: Secondary | ICD-10-CM

## 2020-01-07 DIAGNOSIS — R809 Proteinuria, unspecified: Secondary | ICD-10-CM

## 2020-01-14 ENCOUNTER — Ambulatory Visit
Admission: RE | Admit: 2020-01-14 | Discharge: 2020-01-14 | Disposition: A | Discharge status: Home / Self Care | Payer: BC Managed Care – PPO | Source: Ambulatory Visit | Attending: Nephrology | Admitting: Nephrology

## 2020-01-14 ENCOUNTER — Other Ambulatory Visit: Payer: Self-pay

## 2020-01-14 DIAGNOSIS — R809 Proteinuria, unspecified: Secondary | ICD-10-CM | POA: Diagnosis not present

## 2020-01-14 DIAGNOSIS — R3129 Other microscopic hematuria: Secondary | ICD-10-CM | POA: Diagnosis present

## 2021-03-08 ENCOUNTER — Ambulatory Visit: Payer: BC Managed Care – PPO | Admitting: Urology

## 2021-03-08 ENCOUNTER — Encounter: Payer: Self-pay | Admitting: Urology

## 2021-03-08 ENCOUNTER — Other Ambulatory Visit: Payer: Self-pay

## 2021-03-08 VITALS — BP 156/81 | HR 91 | Ht 71.0 in | Wt 210.0 lb

## 2021-03-08 DIAGNOSIS — R310 Gross hematuria: Secondary | ICD-10-CM

## 2021-03-08 DIAGNOSIS — R3121 Asymptomatic microscopic hematuria: Secondary | ICD-10-CM | POA: Diagnosis not present

## 2021-03-09 LAB — URINALYSIS, COMPLETE
Bilirubin, UA: NEGATIVE
Glucose, UA: NEGATIVE
Ketones, UA: NEGATIVE
Leukocytes,UA: NEGATIVE
Nitrite, UA: NEGATIVE
Protein,UA: NEGATIVE
Specific Gravity, UA: 1.01 (ref 1.005–1.030)
Urobilinogen, Ur: 0.2 mg/dL (ref 0.2–1.0)
pH, UA: 6.5 (ref 5.0–7.5)

## 2021-03-09 LAB — MICROSCOPIC EXAMINATION
Bacteria, UA: NONE SEEN
Epithelial Cells (non renal): NONE SEEN /hpf (ref 0–10)
WBC, UA: NONE SEEN /hpf (ref 0–5)

## 2021-03-10 NOTE — Progress Notes (Signed)
03/08/2021 10:21 AM   Gregory Walls 1990/11/18 [id number]  Referring provider: Tracie Harrier, MD [address] [facility] [place],  [place] [address]  Chief Complaint  Patient presents with   Hematuria    HPI: Gregory Walls is a 31 y.o. male referred for evaluation of hematuria.  States blood has been noted in his urine since he was in high school  Denies gross hematuria No bothersome LUTS Denies dysuria, gross hematuria Denies flank, abdominal or pelvic pain UA 01/2021 with 4-10 RBCs Appendicitis in 2018 and CT showed no renal, ureteral or bladder abnormalities Was referred to nephrology November 2021 and renal ultrasound was ordered which showed no abnormalities He did not follow-up with nephrology No tobacco history   PMH: History reviewed. No pertinent past medical history.  Surgical History: Past Surgical History:  Procedure Laterality Date   LAPAROSCOPIC APPENDECTOMY N/A 07/01/2016   Procedure: APPENDECTOMY LAPAROSCOPIC;  Surgeon: Jules Husbands, MD;  Location: [facility] ORS;  Service: General;  Laterality: N/A;    Home Medications:  Allergies as of 03/08/2021       Reactions   Bactrim [sulfamethoxazole-trimethoprim] Rash   chills        Medication List        Accurate as of March 08, 2021 11:59 PM. If you have any questions, ask your nurse or doctor.          tadalafil 5 MG tablet Commonly known as: CIALIS Take by mouth.   testosterone cypionate 100 MG/ML injection Commonly known as: DEPOTESTOTERONE CYPIONATE 60 mg 3 days a week        Allergies:  Allergies  Allergen Reactions   Bactrim [Sulfamethoxazole-Trimethoprim] Rash    chills    Family History: History reviewed. No pertinent family history.  Social History:  reports that he has never smoked. He has never used smokeless tobacco. He reports current alcohol use. He reports that he does not use drugs.   Physical Exam: BP (!) 156/81    Pulse 91    Ht 5'  11" (1.803 m)    Wt 210 lb (95.3 kg)    BMI 29.29 kg/m   Constitutional:  Alert and oriented, No acute distress. HEENT: [place] AT, moist mucus membranes.  Trachea midline, no masses. Cardiovascular: No clubbing, cyanosis, or edema. Respiratory: Normal respiratory effort, no increased work of breathing. Neurologic: Grossly intact, no focal deficits, moving all 4 extremities. Psychiatric: Normal mood and affect.  Laboratory Data:  Urinalysis Dipstick 1+ blood Microscopy 3-10 RBC  Pertinent Imaging: Renal ultrasound images were personally viewed and interpreted  US RENAL  Narrative CLINICAL DATA:  Microhematuria  EXAM: RENAL / URINARY TRACT ULTRASOUND COMPLETE  COMPARISON:  None.  FINDINGS: Right Kidney:  Renal measurements: 11.3 x 4.7 x 5.5 cm = volume: 150.3 mL. Echogenicity within normal limits. No mass or hydronephrosis visualized.  Left Kidney:  Renal measurements: 10.6 x 5 x 5.9 cm = volume: 164.6 mL. Echogenicity within normal limits. No mass or hydronephrosis visualized.  Bladder:  Appears normal for degree of bladder distention.  Other:  None.  IMPRESSION: Negative examination   Electronically Signed By: Donavan Foil M.D. On: 01/15/2020 23:48   Assessment & Plan:    1.  Microscopic hematuria AUA hematuria risk stratification: Low We discussed the recommended evaluation of low risk hematuria which includes renal ultrasound and cystoscopy He has had a negative renal ultrasound Since his microhematuria has been present for several years he wants to hold off on cystoscopy for  now We discussed lower tract evaluation is incomplete without cystoscopy.  Although the possibility of a bladder tumor is low this could not be diagnosed without cystoscopy. Recommend follow-up UA in 6 months and instructed to call earlier should he develop gross hematuria    Abbie Sons, MD  [facility] [address], [place], [place] [address] [phone]

## 2021-06-02 IMAGING — CR DG KNEE COMPLETE 4+V*L*
1 series · 4 of 4 positions shown · non-contrast
Comparison: None.

CLINICAL DATA: Status post trauma.

EXAM:
LEFT KNEE - COMPLETE 4+ VIEW

[Series 1: dg knee complete 4 views left · 0.14mm/px · 4 of 4 slices shown]
[im 1/4]
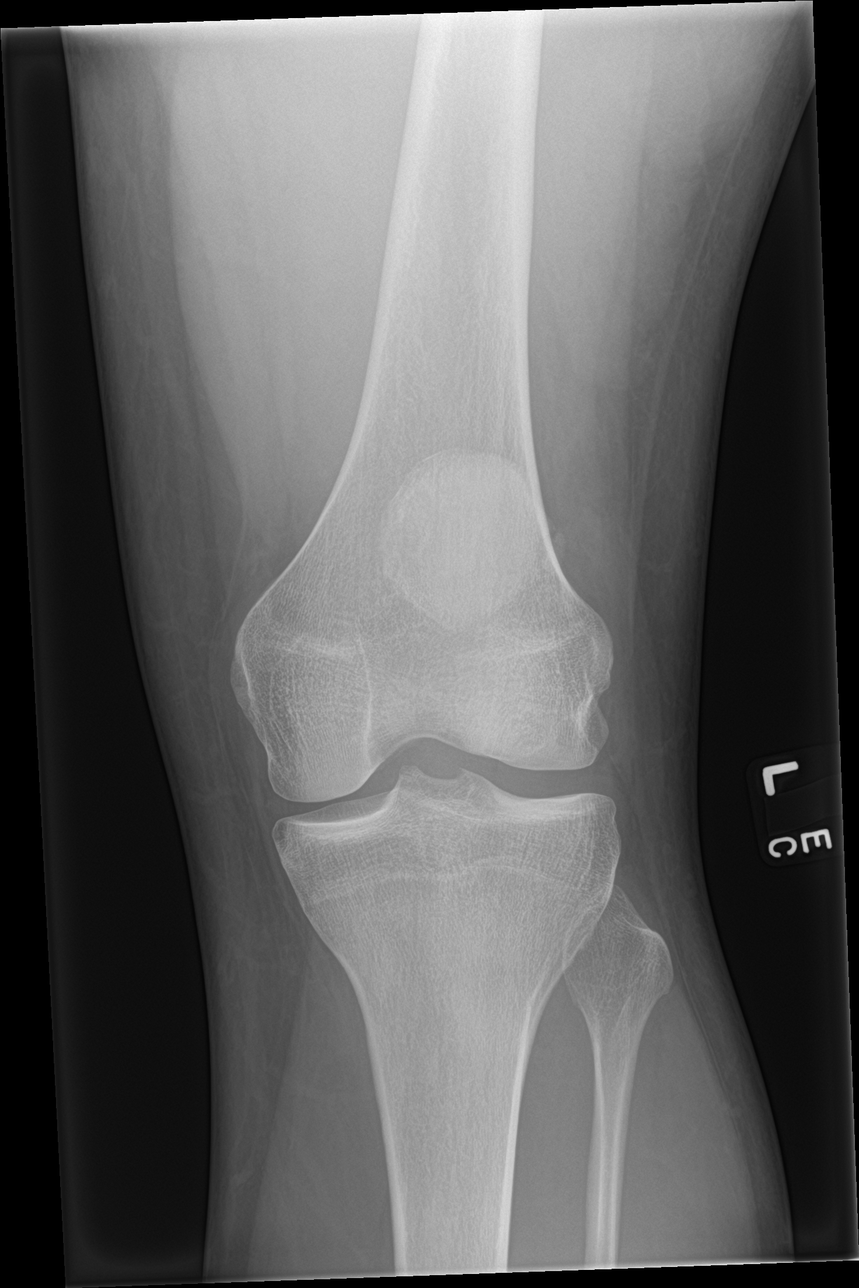
[im 2/4]
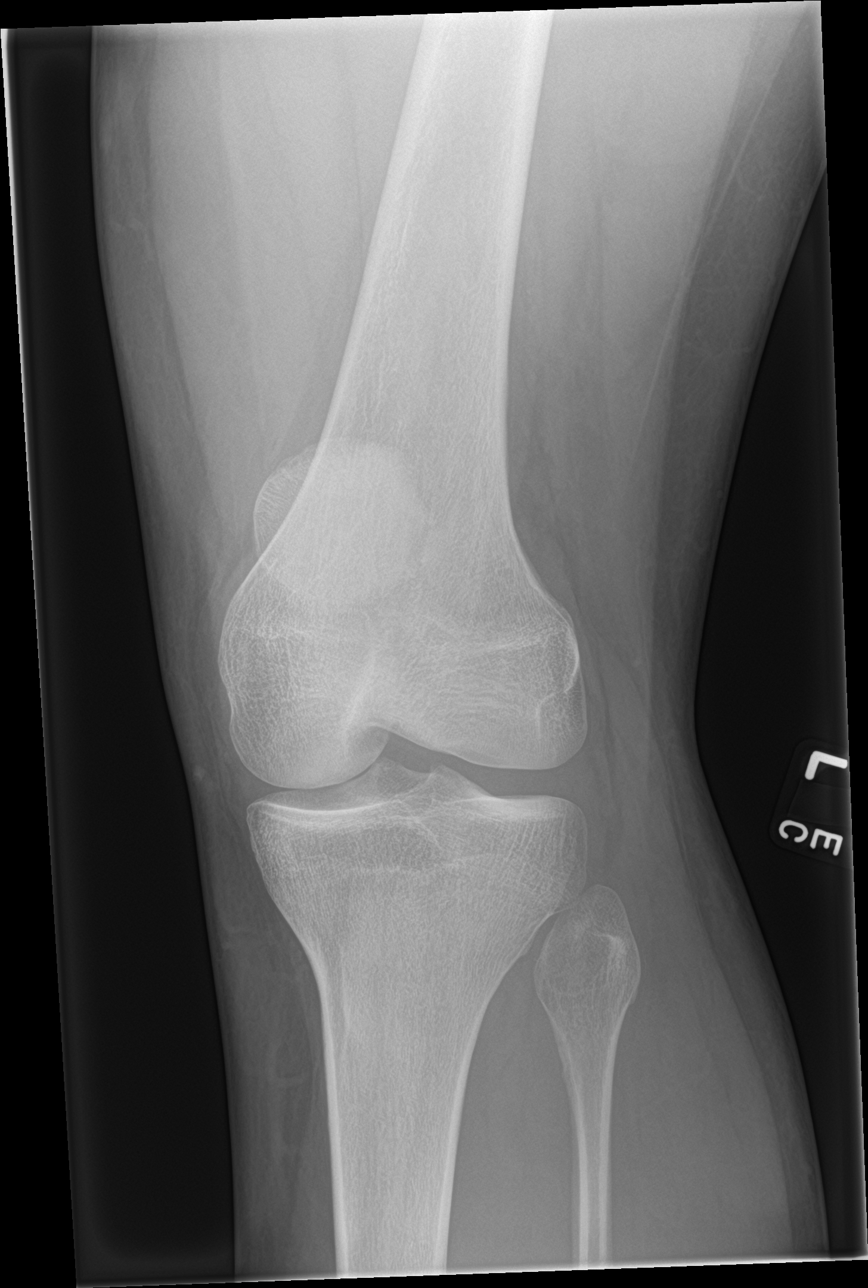
[im 3/4]
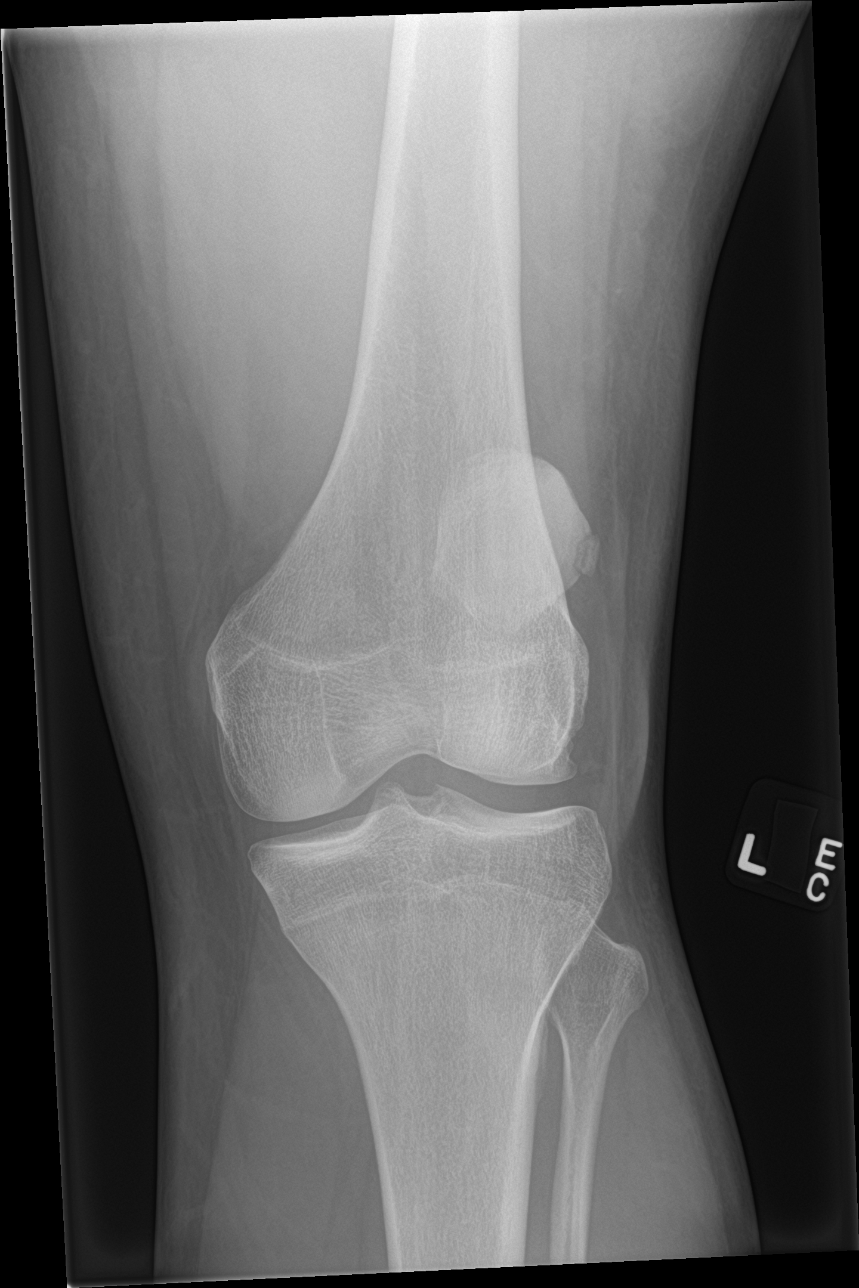
[im 4/4]
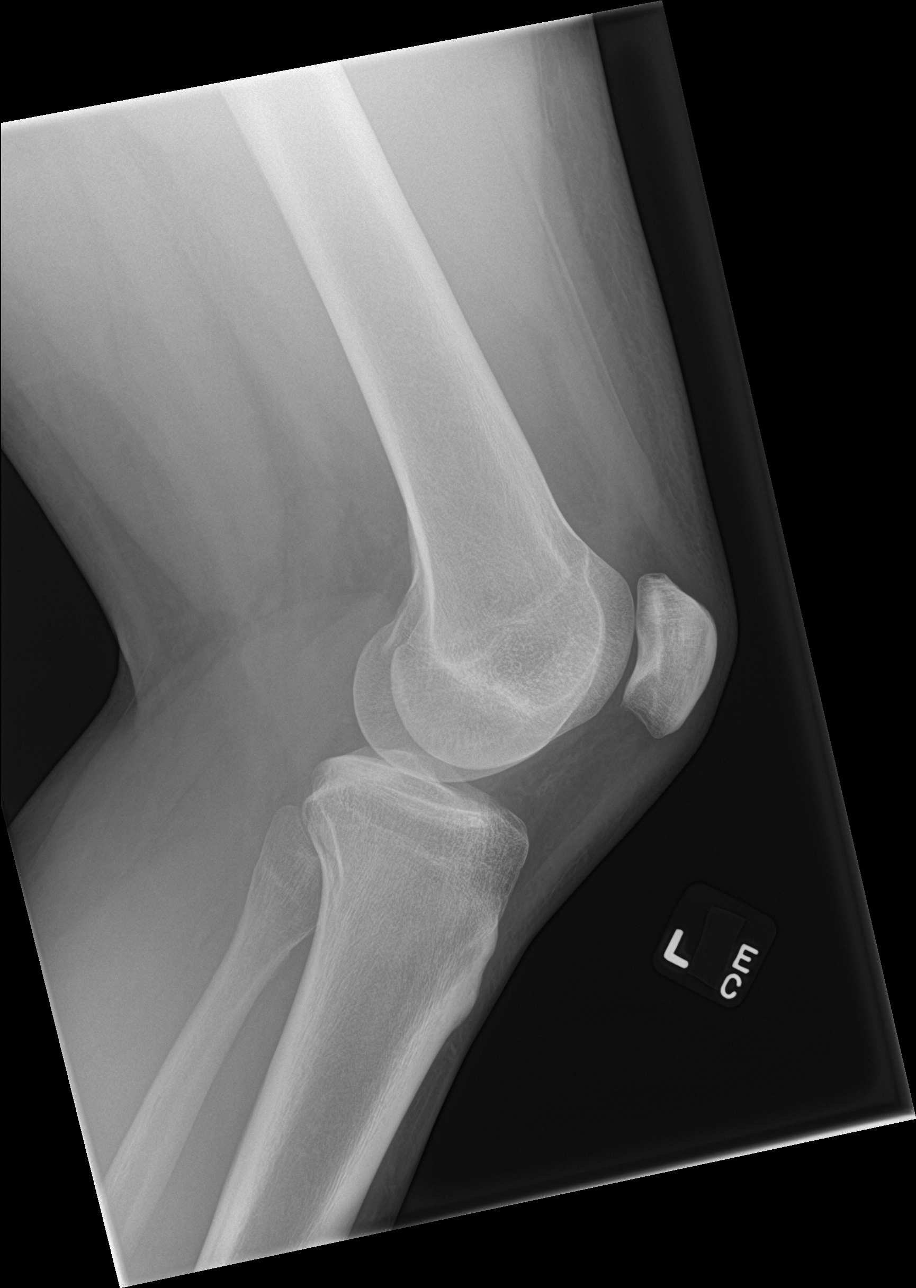

[4 of 4 positions shown; findings below may reference images not displayed]

FINDINGS: No evidence of fracture, dislocation, or joint effusion. No evidence
of arthropathy or other focal bone abnormality. Soft tissues are
unremarkable.
IMPRESSION: Negative.

## 2021-06-24 ENCOUNTER — Emergency Department
Admission: EM | Admit: 2021-06-24 | Discharge: 2021-06-24 | Disposition: A | Discharge status: Home / Self Care | Payer: BC Managed Care – PPO | Attending: Emergency Medicine | Admitting: Emergency Medicine

## 2021-06-24 ENCOUNTER — Emergency Department: Payer: BC Managed Care – PPO

## 2021-06-24 DIAGNOSIS — S43014A Anterior dislocation of right humerus, initial encounter: Secondary | ICD-10-CM | POA: Diagnosis not present

## 2021-06-24 DIAGNOSIS — W108XXA Fall (on) (from) other stairs and steps, initial encounter: Secondary | ICD-10-CM | POA: Insufficient documentation

## 2021-06-24 DIAGNOSIS — S4991XA Unspecified injury of right shoulder and upper arm, initial encounter: Secondary | ICD-10-CM | POA: Diagnosis present

## 2021-06-24 MED ORDER — LIDOCAINE HCL (PF) 1 % IJ SOLN
10.0000 mL | Freq: Once | INTRAMUSCULAR | Status: AC
  Administered 2021-06-24: 10 mL
  Filled 2021-06-24: qty 10

## 2021-06-24 NOTE — ED Notes (Signed)
Pt A&O, pt given discharge instructions, pt ambulating with steady gait. 

## 2021-06-24 NOTE — ED Provider Notes (Signed)
? ?[facility] ?Provider Note ? ? ? Event Date/Time  ? First MD Initiated Contact with Patient 06/24/21 1758   ?  (approximate) ? ? ?History  ? ?Shoulder Pain ? ? ?HPI ? ?Gregory Walls is a 31 y.o. male who presents to the ED for evaluation of Shoulder Pain ?  ?I reviewed orthopedic clinic visit from 2021.  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 ? ?Patient presents to the ED for evaluation of right shoulder pain and concerns for dislocation after accidentally tripping and falling on the steps.  Reports catching himself with his right arm and causing injury.  This occurred just prior to arrival.  Denies head trauma or syncope.  Denies any other pain.  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 ? ? ?Physical Exam  ? ?Triage Vital Signs: ?ED Triage Vitals  ?Enc Vitals Group  ?   BP 06/24/21 1806 (!) 145/88  ?   Pulse Rate 06/24/21 1806 100  ?   Resp 06/24/21 1806 18  ?   Temp 06/24/21 1806 97.9 ?F (36.6 ?C)  ?   Temp Source 06/24/21 1806 Oral  ?   SpO2 06/24/21 1806 100 %  ?   Weight --   ?   Height --   ?   Head Circumference --   ?   Peak Flow --   ?   Pain Score 06/24/21 1756 3  ?   Pain Loc --   ?   Pain Edu? --   ?   Excl. in GC? --   ? ? ?Most recent vital signs: ?Vitals:  ? 06/24/21 1806 06/24/21 1949  ?BP: (!) 145/88 137/90  ?Pulse: 100 92  ?Resp: 18 18  ?Temp: 97.9 ?F (36.6 ?C) 98 ?F (36.7 ?C)  ?SpO2: 100% 99%  ? ? ?General: Awake, no distress.  Sitting upright on the edge of the bed.  Has right arm and elbow on bedside table without much pain. ?CV:  Good peripheral perfusion.  ?Resp:  Normal effort.  ?Abd:  No distention.  ?MSK:  Right arm is distally neurovascularly intact.  No external signs of trauma.  Does seem to have an empty glenohumeral joint on the right ?Neuro:  No focal deficits appreciated. ?Other:   ? ? ?ED Results / Procedures / Treatments  ? ?Labs ?(all labs ordered are listed, but only abnormal results are  displayed) ?Labs Reviewed - No data to display ? ?EKG ? ? ?RADIOLOGY ?Plain film of the right shoulder reviewed by me with probable anterior dislocation, no clear fracture identified. ?Repeat plain film reviewed by me confirms reduction ? ?Official radiology report(s): ?DG Shoulder Right Portable ? ?Result Date: 06/24/2021 ?CLINICAL DATA:  Post reduction images. EXAM: RIGHT SHOULDER - 1 VIEW COMPARISON:  June 24, 2021 (6:39 p.m.) FINDINGS: There is no evidence of fracture or dislocation. There is no evidence of arthropathy or other focal bone abnormality. Soft tissues are unremarkable. IMPRESSION: Interval reduction of the anterior right shoulder dislocation seen on the prior study. Electronically Signed   By: Aram Candela M.D.   On: 06/24/2021 19:31  ? ?DG Shoulder Right Portable ? ?Result Date: 06/24/2021 ?CLINICAL DATA:  Trauma, fell down steps, unable to move the shoulder EXAM: RIGHT SHOULDER - 1 VIEW COMPARISON:  07/13/2012 FINDINGS: Examination is technically limited as the patient is right upper extremity is extended in all the images limiting evaluation of shoulder. As far as seen humeral head appears  more medial than usual suggesting possible anterior subcoracoid dislocation. No definite fracture lines are seen. Postreduction views should be considered to evaluate for any undisplaced fractures. IMPRESSION: Technically limited study due to patient's clinical condition. There is possible anterior dislocation. Electronically Signed   By: Elmer Picker M.D.   On: 06/24/2021 19:02   ? ?PROCEDURES and INTERVENTIONS: ? ?Injection of joint ? ?Date/Time: 06/24/2021 8:19 PM ?Performed by: Vladimir Crofts, MD ?Authorized by: Vladimir Crofts, MD  ?Consent: Verbal consent obtained. ?Risks and benefits: risks, benefits and alternatives were discussed ?Consent given by: patient ?Time out: Immediately prior to procedure a "time out" was called to verify the correct patient, procedure, equipment, support staff and  site/side marked as required. ?Preparation: Patient was prepped and draped in the usual sterile fashion. ?Patient tolerance: patient tolerated the procedure well with no immediate complications ?Comments: Using landmarks, 10 cc of 1% plain lidocaine injected to the right glenohumeral joint.  Cleaned with alcohol swabs.  Tolerated well. ? ? ?Reduction of dislocation ? ?Date/Time: 06/24/2021 8:19 PM ?Performed by: Vladimir Crofts, MD ?Authorized by: Vladimir Crofts, MD  ?Risks and benefits: risks, benefits and alternatives were discussed ?Consent given by: patient ?Patient tolerance: patient tolerated the procedure well with no immediate complications ?Comments: After analgesia with intra-articular lidocaine, external rotation abduction and traction applied with reduction of right shoulder dislocation. ? ? ? ?Medications  ?lidocaine (PF) (XYLOCAINE) 1 % injection 10 mL (10 mLs Infiltration Given by Other 06/24/21 1841)  ? ? ? ?IMPRESSION / MDM / ASSESSMENT AND PLAN / ED COURSE  ?I reviewed the triage vital signs and the nursing notes. ? ?31 year old male who has had multiple dislocations in the past presents to the ED with right shoulder closed anterior dislocation suitable for bedside reduction and outpatient management with orthopedic follow-up.  He presents with controlled pain and clinically dislocated shoulder without signs of neurologic or vascular deficits.  X-ray confirms dislocation without associated fracture.  No other signs of trauma on exam.  Intra-articular block applied with subsequent closed reduction by me.  Follow-up plain film confirms reduction.  Placed in a sling and provided referral to orthopedics.  Suitable for outpatient management. ? ?Clinical Course as of 06/24/21 2020  ?Sun Jun 24, 2021  ?1829 10 cc of 1% plain lidocaine injected into the right glenohumeral joint by me, by landmarks.  Awaiting x-ray [DS]  ?1911 Reduced. Will repeat xr. Pt placed in sling with me and RN. Advised patient to avoid  abduction [DS]  ?1935 Reassessed after x-ray.  He looks well.  Discussed following up with orthopedics and work precautions.   [DS]  ?  ?Clinical Course User Index ?[DS] Vladimir Crofts, MD  ? ? ? ?FINAL CLINICAL IMPRESSION(S) / ED DIAGNOSES  ? ?Final diagnoses:  ?Anterior shoulder dislocation, right, initial encounter  ? ? ? ?Rx / DC Orders  ? ?ED Discharge Orders   ? ? None  ? ?  ? ? ? ?Note:  This document was prepared using Dragon voice recognition software and may include unintentional dictation errors. ?  ?Vladimir Crofts, MD ?06/24/21 2021 ? ?

## 2021-06-24 NOTE — ED Triage Notes (Signed)
Pt fell down some stairs and possibly dislocated his shoulder. Pt has done this before. Doesn't remember if they had to use meds to get it back in last time.  ?

## 2021-09-05 ENCOUNTER — Ambulatory Visit: Payer: BC Managed Care – PPO | Admitting: Urology
